# Patient Record
Sex: Female | Born: 1985 | Race: Black or African American | Hispanic: No | Marital: Single | Smoking: Never smoker
Health system: Southern US, Community
[De-identification: ages and names within clinical notes are randomized; demographics above are authoritative.]

## PROBLEM LIST (undated history)

## (undated) DIAGNOSIS — D649 Anemia, unspecified: Secondary | ICD-10-CM

## (undated) DIAGNOSIS — Z789 Other specified health status: Secondary | ICD-10-CM

## (undated) DIAGNOSIS — R011 Cardiac murmur, unspecified: Secondary | ICD-10-CM

## (undated) HISTORY — DX: Other specified health status: Z78.9

## (undated) HISTORY — PX: NO PAST SURGERIES: SHX2092

---
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
## 2015-06-23 ENCOUNTER — Emergency Department (HOSPITAL_COMMUNITY)
Admission: EM | Admit: 2015-06-23 | Discharge: 2015-06-23 | Disposition: A | Discharge status: Home / Self Care | Payer: Self-pay | Attending: Emergency Medicine | Admitting: Emergency Medicine

## 2015-06-23 ENCOUNTER — Encounter (HOSPITAL_COMMUNITY): Payer: Self-pay | Admitting: Nurse Practitioner

## 2015-06-23 DIAGNOSIS — R112 Nausea with vomiting, unspecified: Secondary | ICD-10-CM | POA: Insufficient documentation

## 2015-06-23 DIAGNOSIS — R197 Diarrhea, unspecified: Secondary | ICD-10-CM | POA: Insufficient documentation

## 2015-06-23 DIAGNOSIS — Z3202 Encounter for pregnancy test, result negative: Secondary | ICD-10-CM | POA: Insufficient documentation

## 2015-06-23 LAB — COMPREHENSIVE METABOLIC PANEL
ALBUMIN: 4.1 g/dL (ref 3.5–5.0)
ALT: 14 U/L (ref 14–54)
ANION GAP: 9 (ref 5–15)
AST: 21 U/L (ref 15–41)
Alkaline Phosphatase: 51 U/L (ref 38–126)
BILIRUBIN TOTAL: 1.2 mg/dL (ref 0.3–1.2)
BUN: 8 mg/dL (ref 6–20)
CO2: 23 mmol/L (ref 22–32)
Calcium: 9.1 mg/dL (ref 8.9–10.3)
Chloride: 101 mmol/L (ref 101–111)
Creatinine, Ser: 0.69 mg/dL (ref 0.44–1.00)
GFR calc Af Amer: >60 mL/min (ref >60)
GFR calc non Af Amer: >60 mL/min (ref >60)
GLUCOSE: 97 mg/dL (ref 65–99)
POTASSIUM: 3.9 mmol/L (ref 3.5–5.1)
SODIUM: 133 mmol/L — AB (ref 135–145)
TOTAL PROTEIN: 7.3 g/dL (ref 6.5–8.1)

## 2015-06-23 LAB — CBC
HEMATOCRIT: 33.5 % — AB (ref 36.0–46.0)
HEMOGLOBIN: 9.9 g/dL — AB (ref 12.0–15.0)
MCH: 21.6 pg — ABNORMAL LOW (ref 26.0–34.0)
MCHC: 29.6 g/dL — AB (ref 30.0–36.0)
MCV: 73.1 fL — ABNORMAL LOW (ref 78.0–100.0)
Platelets: 374 10*3/uL (ref 150–400)
RBC: 4.58 MIL/uL (ref 3.87–5.11)
RDW: 17.5 % — ABNORMAL HIGH (ref 11.5–15.5)
WBC: 8.9 10*3/uL (ref 4.0–10.5)

## 2015-06-23 LAB — I-STAT BETA HCG BLOOD, ED (MC, WL, AP ONLY): I-stat hCG, quantitative: <5 m[IU]/mL (ref <5)

## 2015-06-23 MED ORDER — ONDANSETRON HCL 4 MG PO TABS: 4.0000 mg | ORAL_TABLET | Freq: Four times a day (QID) | ORAL | Status: DC

## 2015-06-23 NOTE — ED Notes (Signed)
She reports body aches and nausea when waking this am. She is feeling better now but she did not go to work and they told her she must have a doctors note for this absence. She does have fatigue still. She is A&Ox4, resp e/u

## 2015-06-23 NOTE — ED Provider Notes (Signed)
CSN: [id number]     Arrival date & time 06/23/15  1258 History  By signing my name below, I, Monique Bowman, attest that this documentation has been prepared under the direction and in the presence of Montine Circle, PA-C Electronically Signed: Soijett Bowman, ED Scribe. 06/23/2015. 3:46 PM.   Chief Complaint  Patient presents with  . Nausea      The history is provided by the patient. No language interpreter was used.    HPI Comments: Monique Bowman is a 29 y.o. female who presents to the Emergency Department complaining of nausea onset this morning at 5 AM. She notes that she woke up this morning and had sudden onset of nausea and diarrhea. She notes that she feels better after she vomited while in the ED. 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 She states that she is having associated symptoms of vomiting, general myalgia, and diarrhea. She states that she has not tried any medications for the relief for her symptoms. She denies constipation, dysuria, CP, difficulty breathing, abdominal pain, and any other symptoms.   No past medical history on file. No past surgical history on file. No family history on file. Social History  Substance Use Topics  . Smoking status: Never Smoker   . Smokeless tobacco: Not on file  . Alcohol Use: No   OB History    No data available     Review of Systems  Constitutional: Negative for fever.  Respiratory: Negative for shortness of breath.   Cardiovascular: Negative for chest pain.  Gastrointestinal: Positive for nausea, vomiting and diarrhea. Negative for abdominal pain.  Skin: Negative for color change and wound.      Allergies  Review of patient's allergies indicates no known allergies.  Home Medications   Prior to Admission medications   Not on File   BP 119/60 mmHg  Pulse 102  Temp(Src) 99 F (37.2 C) (Oral)  Resp 17  Ht 5\' 4"  (1.626 m)  Wt 191 lb 6.4 oz  (86.818 kg)  BMI 32.84 kg/m2  SpO2 100%  LMP 06/18/2015 Physical Exam  Constitutional: She is oriented to person, place, and time. She appears well-developed and well-nourished. No distress.  HENT:  Head: Normocephalic and atraumatic.  Eyes: Conjunctivae and EOM are normal. Pupils are equal, round, and reactive to light.  Neck: Normal range of motion. Neck supple.  Cardiovascular: Normal rate and regular rhythm.  Exam reveals no gallop and no friction rub.   No murmur heard. Pulmonary/Chest: Effort normal and breath sounds normal. No respiratory distress. She has no wheezes. She has no rales. She exhibits no tenderness.  Abdominal: Soft. Bowel sounds are normal. She exhibits no distension and no mass. There is no tenderness. There is no rebound and no guarding.  No focal abdominal tenderness, no RLQ tenderness or pain at McBurney's point, no RUQ tenderness or Murphy's sign, no left-sided abdominal tenderness, no fluid wave, or signs of peritonitis   Musculoskeletal: Normal range of motion. She exhibits no edema or tenderness.  Neurological: She is alert and oriented to person, place, and time.  Skin: Skin is warm and dry.  Psychiatric: She has a normal mood and affect. Her behavior is normal. Judgment and thought content normal.  Nursing note and vitals reviewed.   ED Course  Procedures (including critical care time) DIAGNOSTIC STUDIES: Oxygen Saturation is 100% on RA, nl by my interpretation.    COORDINATION OF CARE:  3:44 PM Discussed treatment plan with pt at bedside which includes labs and zofran,  and pt agreed to plan.   Results for orders placed or performed during the hospital encounter of 06/23/15  Comprehensive metabolic panel  Result Value Ref Range   Sodium 133 (L) 135 - 145 mmol/L   Potassium 3.9 3.5 - 5.1 mmol/L   Chloride 101 101 - 111 mmol/L   CO2 23 22 - 32 mmol/L   Glucose, Bld 97 65 - 99 mg/dL   BUN 8 6 - 20 mg/dL   Creatinine, Ser 0.69 0.44 - 1.00 mg/dL    Calcium 9.1 8.9 - 10.3 mg/dL   Total Protein 7.3 6.5 - 8.1 g/dL   Albumin 4.1 3.5 - 5.0 g/dL   AST 21 15 - 41 U/L   ALT 14 14 - 54 U/L   Alkaline Phosphatase 51 38 - 126 U/L   Total Bilirubin 1.2 0.3 - 1.2 mg/dL   GFR calc non Af Amer >60 >60 mL/min   GFR calc Af Amer >60 >60 mL/min   Anion gap 9 5 - 15  CBC  Result Value Ref Range   WBC 8.9 4.0 - 10.5 K/uL   RBC 4.58 3.87 - 5.11 MIL/uL   Hemoglobin 9.9 (L) 12.0 - 15.0 g/dL   HCT 33.5 (L) 36.0 - 46.0 %   MCV 73.1 (L) 78.0 - 100.0 fL   MCH 21.6 (L) 26.0 - 34.0 pg   MCHC 29.6 (L) 30.0 - 36.0 g/dL   RDW 17.5 (H) 11.5 - 15.5 %   Platelets 374 150 - 400 K/uL  I-Stat beta hCG blood, ED ([facility], [facility], [facility] only)  Result Value Ref Range   I-stat hCG, quantitative <5.0 <5 mIU/mL   Comment 3           No results found.    MDM   Final diagnoses:  Nausea and vomiting, vomiting of unspecified type    Patient with nausea and vomiting. She also reports generalized body aches. She states that she is feeling much better now having had one episode of vomiting. She denies any abdominal pain. Denies any chest pain or shortness breath. Denies any dysuria. Her vital signs and laboratory studies are reassuring. Pregnancy test negative. No focal abdominal pain. Abdomen is soft and non-tender. Patient is well-appearing. She is not in any apparent distress. I this time, patient can be managed on an outpatient basis. Will prescribe Zofran for nausea. Specific return precautions have been discussed with the patient. She will need to return if she has any worsening symptoms, or focal abdominal pain, or fever. Patient understands and agrees to plan. She is stable and ready for discharge.  Patient instructed to return for:  New or worsening symptoms, including, increased abdominal pain, especially pain that localizes to one side, bloody vomit, bloody diarrhea, fever >101, and intractable vomiting.  I, [name], personally performed the services described  in this documentation. All medical record entries made by the scribe were at my direction and in my presence.  I have reviewed the chart and discharge instructions and agree that the record reflects my personal performance and is accurate and complete. Demitrious Mccannon.  06/23/2015. 3:50 PM.       Montine Circle, PA-C 06/23/15 [place], MD 06/24/15 1752

## 2015-06-23 NOTE — Discharge Instructions (Signed)

## 2015-09-29 ENCOUNTER — Encounter (HOSPITAL_COMMUNITY): Payer: Self-pay

## 2015-09-29 ENCOUNTER — Emergency Department (HOSPITAL_COMMUNITY): Payer: Managed Care, Other (non HMO)

## 2015-09-29 ENCOUNTER — Emergency Department (HOSPITAL_COMMUNITY)
Admission: EM | Admit: 2015-09-29 | Discharge: 2015-09-29 | Disposition: A | Discharge status: Home / Self Care | Payer: Self-pay | Attending: Emergency Medicine | Admitting: Emergency Medicine

## 2015-09-29 DIAGNOSIS — R197 Diarrhea, unspecified: Secondary | ICD-10-CM | POA: Insufficient documentation

## 2015-09-29 DIAGNOSIS — R1084 Generalized abdominal pain: Secondary | ICD-10-CM | POA: Insufficient documentation

## 2015-09-29 DIAGNOSIS — Z79899 Other long term (current) drug therapy: Secondary | ICD-10-CM | POA: Insufficient documentation

## 2015-09-29 DIAGNOSIS — R112 Nausea with vomiting, unspecified: Secondary | ICD-10-CM | POA: Insufficient documentation

## 2015-09-29 DIAGNOSIS — Z3202 Encounter for pregnancy test, result negative: Secondary | ICD-10-CM | POA: Insufficient documentation

## 2015-09-29 LAB — URINALYSIS, ROUTINE W REFLEX MICROSCOPIC
BILIRUBIN URINE: NEGATIVE
Glucose, UA: NEGATIVE mg/dL
Hgb urine dipstick: NEGATIVE
Ketones, ur: NEGATIVE mg/dL
LEUKOCYTES UA: NEGATIVE
Nitrite: NEGATIVE
PROTEIN: NEGATIVE mg/dL
SPECIFIC GRAVITY, URINE: 1.028 (ref 1.005–1.030)
pH: 5.5 (ref 5.0–8.0)

## 2015-09-29 LAB — I-STAT BETA HCG BLOOD, ED (MC, WL, AP ONLY)

## 2015-09-29 LAB — COMPREHENSIVE METABOLIC PANEL
ALT: 11 U/L — ABNORMAL LOW (ref 14–54)
AST: 17 U/L (ref 15–41)
Albumin: 4.3 g/dL (ref 3.5–5.0)
Alkaline Phosphatase: 47 U/L (ref 38–126)
Anion gap: 10 (ref 5–15)
BUN: 13 mg/dL (ref 6–20)
CHLORIDE: 107 mmol/L (ref 101–111)
CO2: 21 mmol/L — ABNORMAL LOW (ref 22–32)
Calcium: 8.8 mg/dL — ABNORMAL LOW (ref 8.9–10.3)
Creatinine, Ser: 0.6 mg/dL (ref 0.44–1.00)
GFR calc Af Amer: >60 mL/min (ref >60)
Glucose, Bld: 100 mg/dL — ABNORMAL HIGH (ref 65–99)
POTASSIUM: 3.8 mmol/L (ref 3.5–5.1)
Sodium: 138 mmol/L (ref 135–145)
Total Bilirubin: 1.8 mg/dL — ABNORMAL HIGH (ref 0.3–1.2)
Total Protein: 7.6 g/dL (ref 6.5–8.1)

## 2015-09-29 LAB — CBC
HEMATOCRIT: 32.6 % — AB (ref 36.0–46.0)
HEMOGLOBIN: 9.6 g/dL — AB (ref 12.0–15.0)
MCH: 21.6 pg — ABNORMAL LOW (ref 26.0–34.0)
MCHC: 29.4 g/dL — ABNORMAL LOW (ref 30.0–36.0)
MCV: 73.4 fL — AB (ref 78.0–100.0)
Platelets: 384 10*3/uL (ref 150–400)
RBC: 4.44 MIL/uL (ref 3.87–5.11)
RDW: 17.1 % — ABNORMAL HIGH (ref 11.5–15.5)
WBC: 8.1 10*3/uL (ref 4.0–10.5)

## 2015-09-29 LAB — LIPASE, BLOOD: LIPASE: 23 U/L (ref 11–51)

## 2015-09-29 MED ORDER — ONDANSETRON HCL 4 MG PO TABS: 4.0000 mg | ORAL_TABLET | Freq: Four times a day (QID) | ORAL | Status: DC

## 2015-09-29 MED ORDER — ONDANSETRON HCL 4 MG/2ML IJ SOLN
4.0000 mg | Freq: Once | INTRAMUSCULAR | Status: AC
  Administered 2015-09-29: 4 mg via INTRAVENOUS
  Filled 2015-09-29: qty 2

## 2015-09-29 MED ORDER — SODIUM CHLORIDE 0.9 % IV BOLUS (SEPSIS)
1000.0000 mL | Freq: Once | INTRAVENOUS | Status: AC
  Administered 2015-09-29: 1000 mL via INTRAVENOUS

## 2015-09-29 NOTE — Discharge Instructions (Signed)
Return to the ED with any concerns including vomiting and not able to keep down liquids, abdominal pain that worsens and/or localizes to the right lower abdomen, decreased level of alertness/lethargy, or any other alarming symptoms

## 2015-09-29 NOTE — ED Provider Notes (Signed)
CSN: [id number]     Arrival date & time 09/29/15  0906 History   First MD Initiated Contact with Patient 09/29/15 0914     Chief Complaint  Patient presents with  . Abdominal Pain  . Emesis     (Consider location/radiation/quality/duration/timing/severity/associated sxs/prior Treatment) HPI  Pt presenting with c/o nausea and vomiting which started last night.  Pt states she vomited approx 6 times last night.  D/w diffuse abdominal pain.  Has had frequent bowel movements as well but no diarrhea.  No fever/chills.  She has tried to drink water but has thrown it back up. Has had coworkers that were sick with similar illness.  Symptoms are constant and ongoing.  No recent travel. There are no other associated systemic symptoms, there are no other alleviating or modifying factors.   History reviewed. No pertinent past medical history. History reviewed. No pertinent past surgical history. History reviewed. No pertinent family history. Social History  Substance Use Topics  . Smoking status: Never Smoker   . Smokeless tobacco: None  . Alcohol Use: No   OB History    No data available     Review of Systems  ROS reviewed and all otherwise negative except for mentioned in HPI    Allergies  Review of patient's allergies indicates no known allergies.  Home Medications   Prior to Admission medications   Medication Sig Start Date End Date Taking? Authorizing Provider  ibuprofen (ADVIL,MOTRIN) 200 MG tablet Take 200 mg by mouth every 6 (six) hours as needed for headache or moderate pain.   Yes Historical Provider, MD  PROTEIN PO Take 1 tablet by mouth daily.   Yes Historical Provider, MD  ondansetron (ZOFRAN) 4 MG tablet Take 1 tablet (4 mg total) by mouth every 6 (six) hours. 09/29/15   Alfonzo Beers, MD   BP 107/65 mmHg  Pulse 90  Temp(Src) 99.8 F (37.7 C) (Oral)  Resp 18  SpO2 100%  LMP 09/13/2015  Vitals reviewed Physical Exam  Physical Examination: General appearance - alert,  well appearing, and in no distress Mental status - alert, oriented to person, place, and time Eyes - no conjunctival injection no scleral icterus Mouth - mucous membranes moist, pharynx normal without lesions Chest - clear to auscultation, no wheezes, rales or rhonchi, symmetric air entry Heart - normal rate, regular rhythm, normal S1, S2, no murmurs, rubs, clicks or gallops Abdomen - soft, nontender, nondistended, no masses or organomegaly, nabs Neurological - alert, oriented, normal speech Extremities - peripheral pulses normal, no pedal edema, no clubbing or cyanosis Skin - normal coloration and turgor, no rashes  ED Course  Procedures (including critical care time) Labs Review Labs Reviewed  COMPREHENSIVE METABOLIC PANEL - Abnormal; Notable for the following:    CO2 21 (*)    Glucose, Bld 100 (*)    Calcium 8.8 (*)    ALT 11 (*)    Total Bilirubin 1.8 (*)    All other components within normal limits  CBC - Abnormal; Notable for the following:    Hemoglobin 9.6 (*)    HCT 32.6 (*)    MCV 73.4 (*)    MCH 21.6 (*)    MCHC 29.4 (*)    RDW 17.1 (*)    All other components within normal limits  LIPASE, BLOOD  URINALYSIS, ROUTINE W REFLEX MICROSCOPIC (NOT AT [facility])  I-STAT BETA HCG BLOOD, ED ([facility], [facility], [facility] ONLY)    Imaging Review US Abdomen Limited  09/29/2015  CLINICAL DATA:  One  day history of abdominal pain and vomiting EXAM: US ABDOMEN LIMITED - RIGHT UPPER QUADRANT COMPARISON:  None. FINDINGS: Gallbladder: No gallstones or wall thickening visualized. There is no pericholecystic fluid. No sonographic Murphy sign noted by sonographer. Common bile duct: Diameter: 4 mm. No intrahepatic or extrahepatic biliary duct dilatation. Liver: No focal lesion identified. Within normal limits in parenchymal echogenicity. IMPRESSION: Study within normal limits. Electronically Signed   By: Lowella Grip III M.D.   On: 09/29/2015 12:30   I have personally reviewed and evaluated these images and  lab results as part of my medical decision-making.   EKG Interpretation None      MDM   Final diagnoses:  Nausea vomiting and diarrhea    Pt presenting with c/o nausea and vomting which began this morning.  Labs are reassuring with the exception of elevated bilirubin- pt treated with IV fluids and antiemetics, abdominal ultrasound obtained and normal.  Discharged with strict return precautions.  Pt agreeable with plan.    Alfonzo Beers, MD 09/29/15 1452

## 2015-09-29 NOTE — ED Notes (Signed)
Pt c/o intermittent generalized abdominal pain and emesis starting last night.  Pain score 4/10.  Denies diarrhea.  Pt reports coworkers have been sick.

## 2015-09-29 NOTE — ED Notes (Signed)
US at bedside

## 2017-04-27 IMAGING — US US ABDOMEN LIMITED
1 series · 14 of 25 positions shown · non-contrast
Comparison: None.

CLINICAL DATA: One day history of abdominal pain and vomiting

EXAM:
US ABDOMEN LIMITED - RIGHT UPPER QUADRANT

[Series 1: us abdomen limited · 0.20mm/px · 14 of 55 slices shown]
[im 1/55]
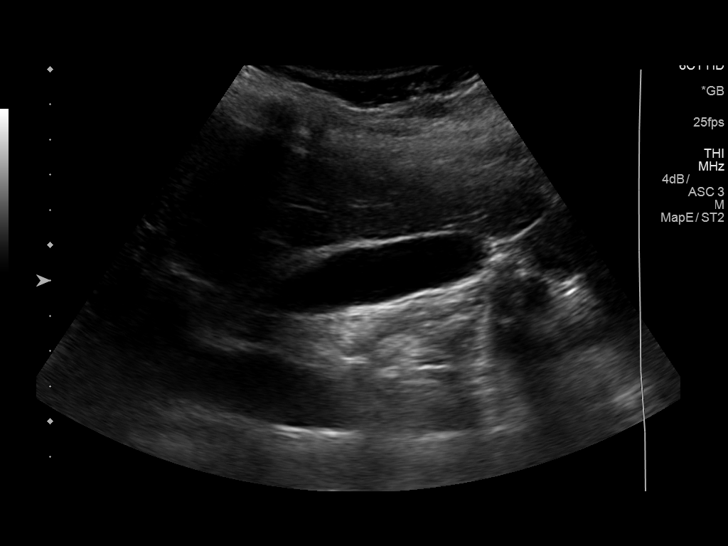
[im 5/55]
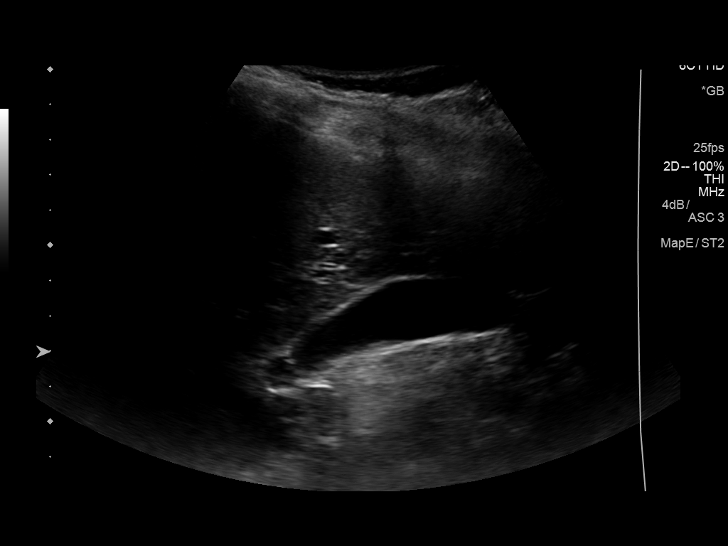
[im 10/55]
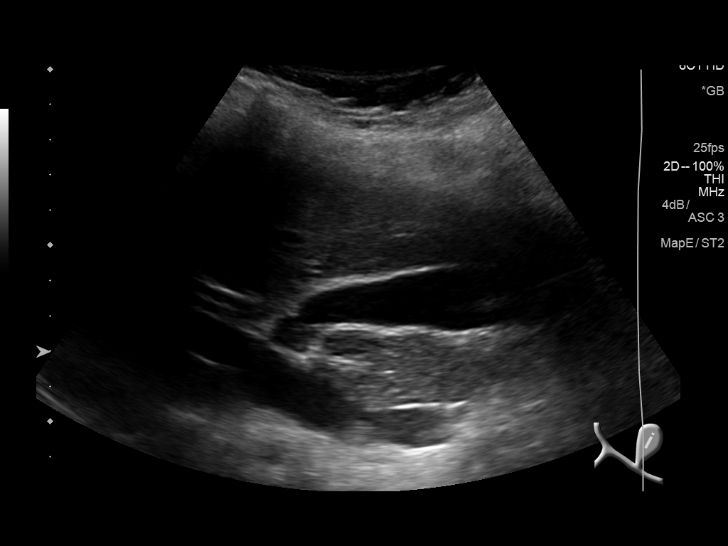
[im 14/55]
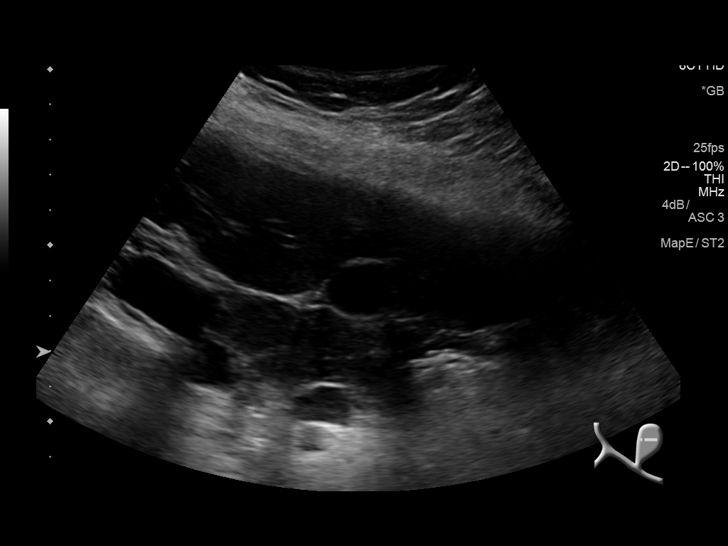
[im 19/55]
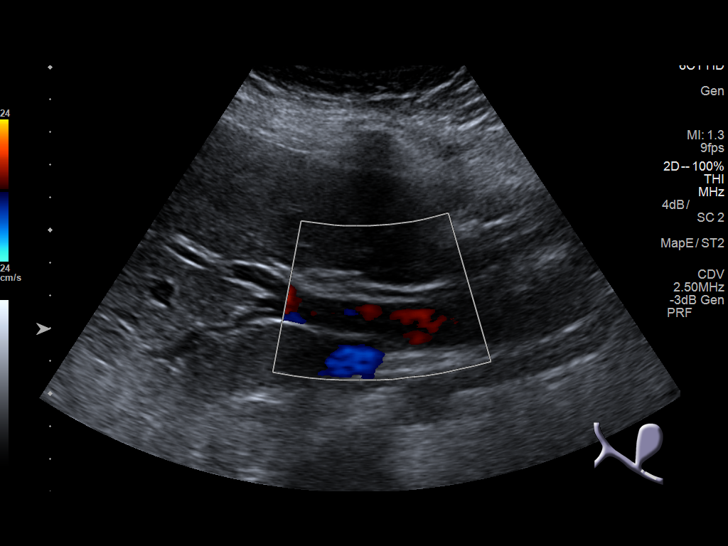
[im 21/55]
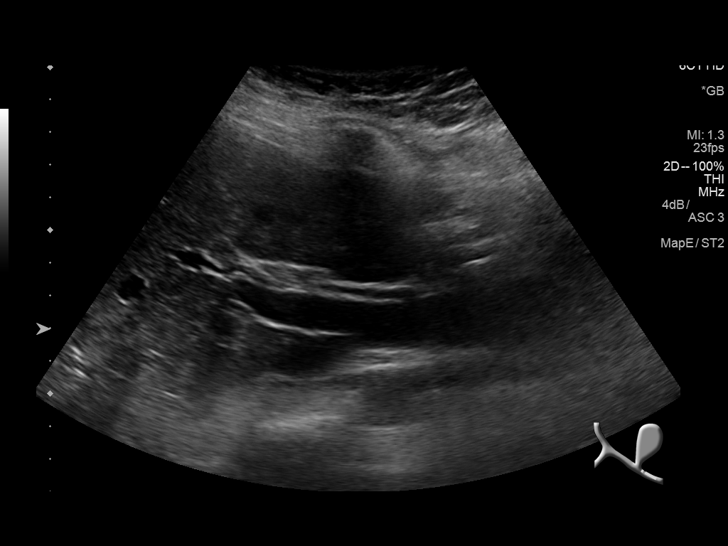
[im 25/55]
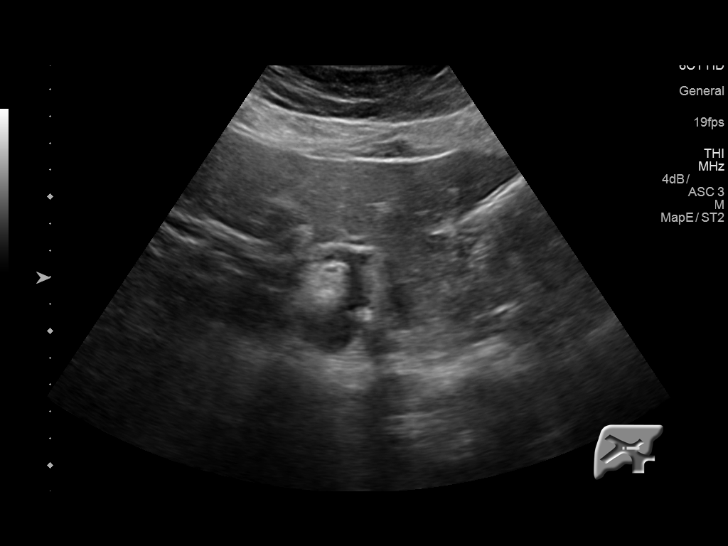
[im 30/55]
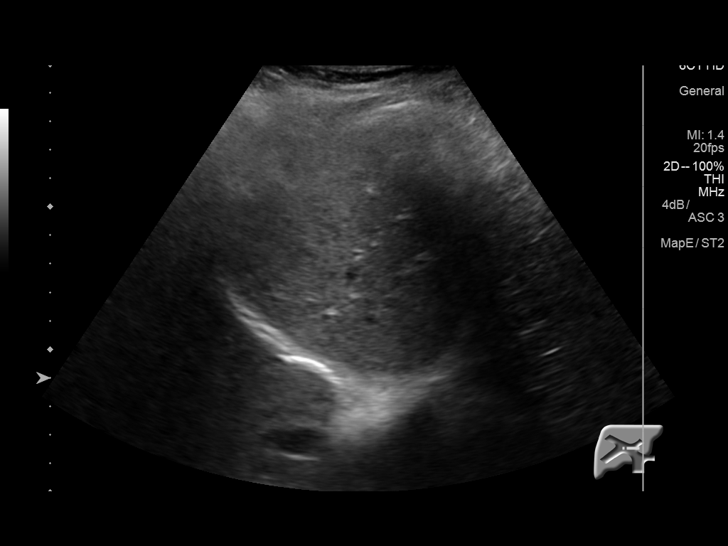
[im 34/55]
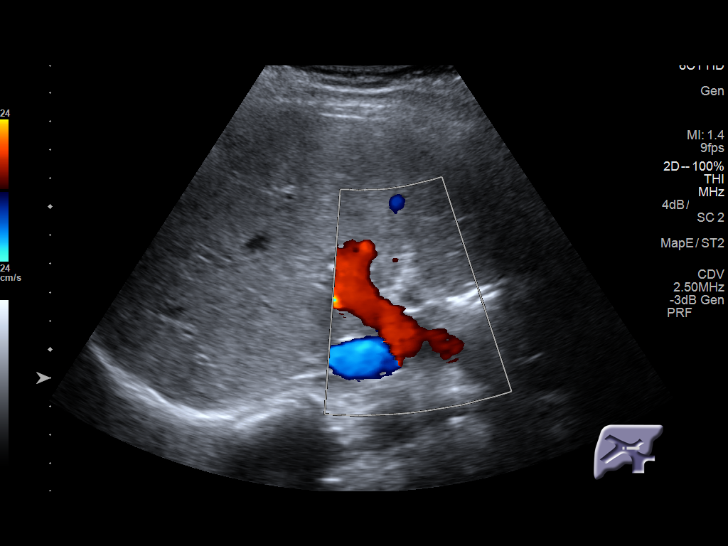
[im 37/55]
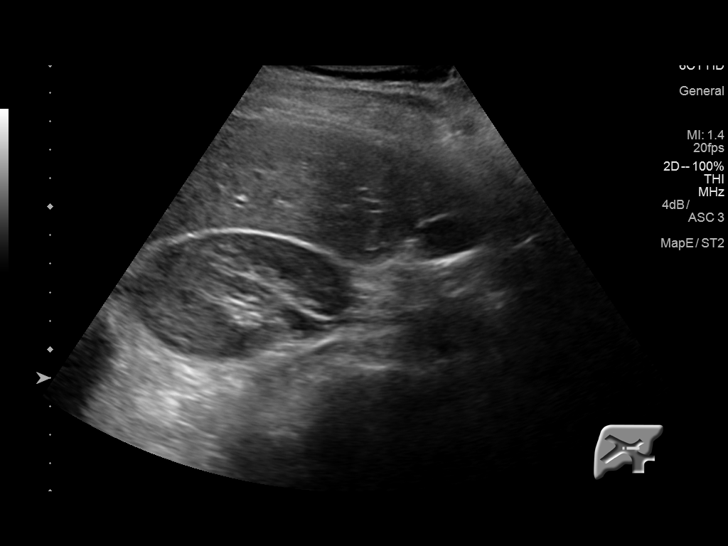
[im 41/55]
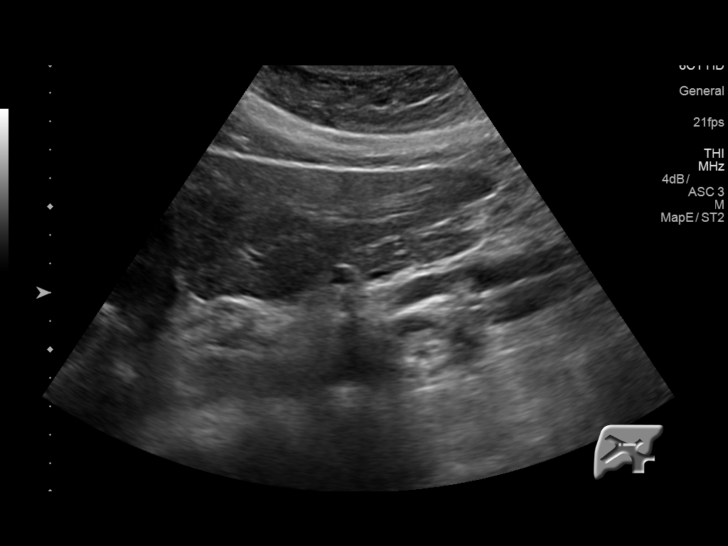
[im 46/55]
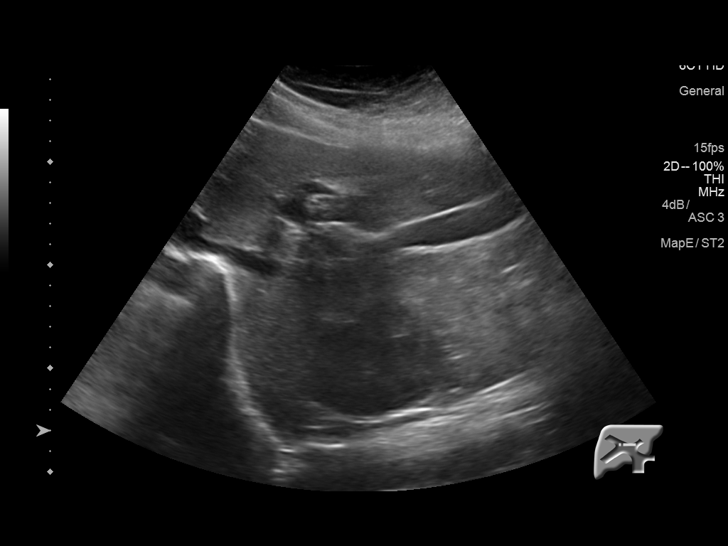
[im 50/55]
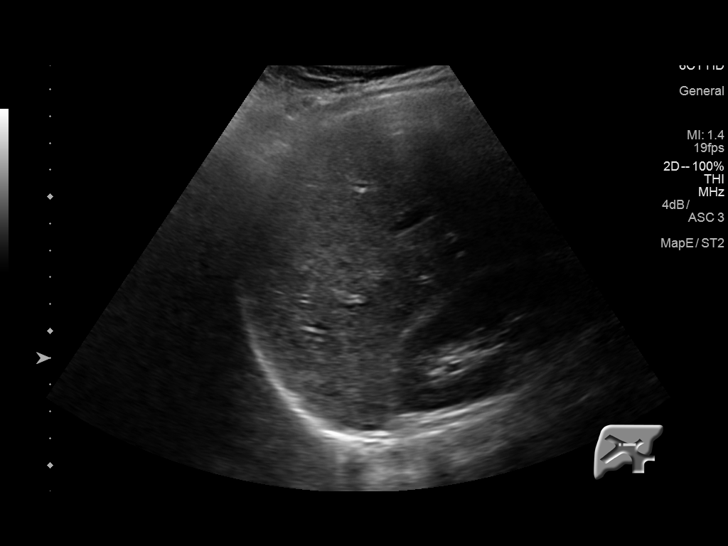
[im 55/55]
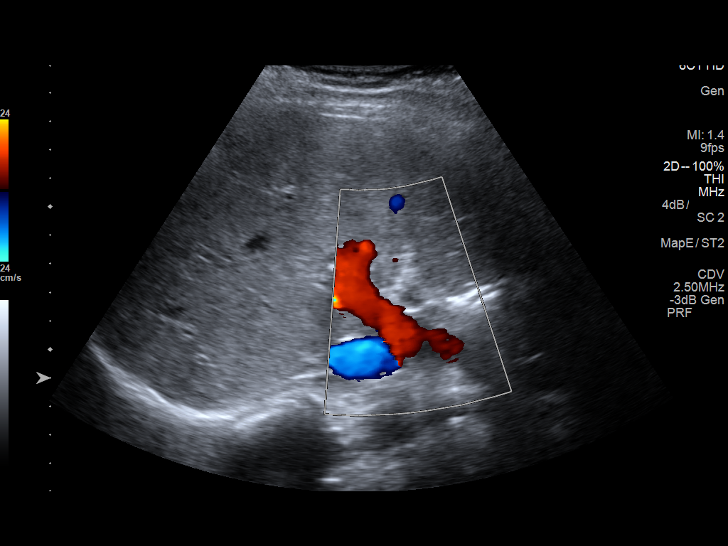

[14 of 25 positions shown; findings below may reference images not displayed]

FINDINGS: Gallbladder:

No gallstones or wall thickening visualized. There is no
pericholecystic fluid. No sonographic Murphy sign noted by
sonographer.

Common bile duct:

Diameter: 4 mm. No intrahepatic or extrahepatic biliary duct
dilatation.

Liver:

No focal lesion identified. Within normal limits in parenchymal
echogenicity.
IMPRESSION: Study within normal limits.

## 2019-12-29 ENCOUNTER — Ambulatory Visit: Payer: Managed Care, Other (non HMO) | Admitting: Family Medicine

## 2020-01-14 ENCOUNTER — Other Ambulatory Visit: Payer: Self-pay

## 2020-01-14 ENCOUNTER — Ambulatory Visit: Payer: Self-pay | Attending: Family Medicine | Admitting: Family Medicine

## 2020-01-14 ENCOUNTER — Encounter: Payer: Self-pay | Admitting: Family Medicine

## 2020-01-14 VITALS — BP 115/55 | HR 102 | Temp 97.7°F | Ht 64.0 in | Wt 188.2 lb

## 2020-01-14 DIAGNOSIS — R5383 Other fatigue: Secondary | ICD-10-CM

## 2020-01-14 DIAGNOSIS — R0609 Other forms of dyspnea: Secondary | ICD-10-CM

## 2020-01-14 DIAGNOSIS — R000 Tachycardia, unspecified: Secondary | ICD-10-CM

## 2020-01-14 DIAGNOSIS — Z789 Other specified health status: Secondary | ICD-10-CM | POA: Insufficient documentation

## 2020-01-14 DIAGNOSIS — Z599 Problem related to housing and economic circumstances, unspecified: Secondary | ICD-10-CM

## 2020-01-14 DIAGNOSIS — Z598 Other problems related to housing and economic circumstances: Secondary | ICD-10-CM

## 2020-01-14 DIAGNOSIS — R0600 Dyspnea, unspecified: Secondary | ICD-10-CM

## 2020-01-14 NOTE — Progress Notes (Signed)
New Patient   Need labs  Elevated HR  Financial Assistance

## 2020-01-14 NOTE — Progress Notes (Addendum)
Subjective:  Patient ID: Monique Bowman, female    DOB: 03/28/1986  Age: 34 y.o. MRN: [id number]  CC: Establish care, financial difficulty  HPI Monique Bowman, 34 year old female who presents to establish care, who reports that she has had no significant past medical issues however she has had recent sensation of increased heart rate especially with activity and she has noticed that she has shortness of breath with activity as well.  She has been trying to lose weight and her health coach has noticed that even though patient has been working out for about 6 weeks now, patient continues to have an elevated heart rate in the 90s to low 100s and patient still feels short of breath when exercising.  She has noticed that lately she has been more fatigued.  She has had no recent medical follow-up due to lack of insurance and she learned about a program through this office that can help with the cost of medical care.  She denies any issues with headaches or dizziness, no chest pain or palpitations other than episodes of sensation of increased heart rate.  She has had no abdominal pain-no nausea/vomiting/diarrhea or constipation.  She denies any bright red blood in the stool but she thinks that she had some black dots in her stool a few weeks ago.  Her menses do occur regularly and are sometimes heavy.  She denies any urinary frequency, urgency or dysuria.  No increased thirst or blurred vision.  No peripheral edema.  No thoughts of self-harm.  She feels slightly anxious about her health and financial difficulties.  Past Medical History:  Diagnosis Date  . Known health problems: none     Past Surgical History:  Procedure Laterality Date  . NO PAST SURGERIES      Family History  Problem Relation Age of Onset  . Diabetes Mother   . Breast cancer Mother        remission  . Ovarian cancer Mother        remission  . Congenital heart disease Brother        Died at age 69    Social History    Tobacco Use  . Smoking status: Never Smoker  . Smokeless tobacco: Never Used  Substance Use Topics  . Alcohol use: No    ROS Review of Systems  Constitutional: Positive for fatigue. Negative for chills and fever.  HENT: Negative for sore throat and trouble swallowing.   Eyes: Negative for photophobia and visual disturbance.  Respiratory: Negative for cough and shortness of breath.   Cardiovascular: Negative for chest pain, palpitations (No abnormal heart rhythms but sensation of increased heart rate especially with activity) and leg swelling.  Gastrointestinal: Negative for abdominal pain, constipation, diarrhea, nausea and vomiting.       Noticed some black spots in her stool a few weeks ago  Endocrine: Negative for cold intolerance, heat intolerance, polydipsia, polyphagia and polyuria.  Genitourinary: Negative for dysuria and frequency.  Musculoskeletal: Negative for arthralgias and back pain.  Skin: Negative for rash and wound.  Neurological: Negative for dizziness and headaches.  Hematological: Negative for adenopathy. Does not bruise/bleed easily.  Psychiatric/Behavioral: Negative for self-injury and suicidal ideas. The patient is nervous/anxious (About finances/health).     Objective:   Today's Vitals: BP (!) 115/55   Pulse (!) 102   Temp 97.7 F (36.5 C) (Temporal)   Ht 5\' 4"  (1.626 m)   Wt 188 lb 3.2 oz (85.4 kg)   LMP 01/02/2020 (Exact  Date)   SpO2 100%   BMI 32.30 kg/m   Physical Exam Vitals and nursing note reviewed.  Constitutional:      Appearance: Normal appearance.     Comments: Overweight for height female who appears younger than stated age in no acute distress wearing mask as per office COVID-19 protocol  Neck:     Vascular: No carotid bruit.  Cardiovascular:     Rate and Rhythm: Regular rhythm. Tachycardia present.     Heart sounds: No murmur (Possible soft murmur).  Pulmonary:     Effort: Pulmonary effort is normal.     Breath sounds: Normal  breath sounds.  Abdominal:     Palpations: Abdomen is soft.     Tenderness: There is no abdominal tenderness. There is no right CVA tenderness, left CVA tenderness, guarding or rebound.  Musculoskeletal:        General: No tenderness or deformity.     Cervical back: Normal range of motion and neck supple. No tenderness.     Right lower leg: No edema.     Left lower leg: No edema.  Lymphadenopathy:     Cervical: No cervical adenopathy.  Skin:    General: Skin is warm and dry.  Neurological:     General: No focal deficit present.     Mental Status: She is alert and oriented to person, place, and time.     Cranial Nerves: No cranial nerve deficit.  Psychiatric:        Mood and Affect: Mood normal.        Behavior: Behavior normal.     Assessment & Plan:  1. Increased heart rate Patient with complaint of sensation of increased heart rate and shortness of breath with exertion..  Will check complete blood count to look for anemia, BMP to look for electrolyte abnormality and T4/TSH to look for thyroid abnormality/hyperthyroidism which may be causing increased heart rate. - CBC - Basic Metabolic Panel - T4 AND TSH  2. Dyspnea on exertion CBC to look for anemia and patient may need chest x-ray if labs are normal and she has continued issues with shortness of breath with exertion.  She denies any history of smoking. - CBC  3. Fatigue, unspecified type She reports recent increase in fatigue.  She denies any possibility of pregnancy as she has never been sexually active.  She will have CBC to look for anemia/blood disorder, BMP to look for elevated blood sugar, renal disorder or electrolyte abnormality, T4 and TSH to look for thyroid abnormality and vitamin D level to look for vitamin D deficiency which may be contributed to look healthy her fatigue. - CBC - Basic Metabolic Panel - T4 AND TSH - Vitamin D, 25-hydroxy  4.  Financial difficulties; 5.  Need for follow-up by social work She  agrees to be contacted by the medical social worker to help with ongoing issues with increased anxiety related to financial issues.  Outpatient Encounter Medications as of 01/14/2020  Medication Sig  . ibuprofen (ADVIL,MOTRIN) 200 MG tablet Take 200 mg by mouth every 6 (six) hours as needed for headache or moderate pain.  Marland Kitchen ondansetron (ZOFRAN) 4 MG tablet Take 1 tablet (4 mg total) by mouth every 6 (six) hours. (Patient not taking: Reported on 01/14/2020)  . PROTEIN PO Take 1 tablet by mouth daily.   No facility-administered encounter medications on file as of 01/14/2020.    An After Visit Summary was printed and given to the patient.   Follow-up: Return in  about 3 weeks (around 02/04/2020) for increased heart rate/SOB.  Return sooner if any questions or concerns, go to ED if any acute worsening of symptoms   Coleson Kant MD

## 2020-01-15 ENCOUNTER — Observation Stay (HOSPITAL_COMMUNITY)
Admission: EM | Admit: 2020-01-15 | Discharge: 2020-01-16 | Disposition: A | Discharge status: Home / Self Care | Payer: Self-pay | Attending: Family Medicine | Admitting: Family Medicine

## 2020-01-15 ENCOUNTER — Observation Stay (HOSPITAL_COMMUNITY): Payer: Self-pay

## 2020-01-15 ENCOUNTER — Encounter: Payer: Self-pay | Admitting: Family Medicine

## 2020-01-15 ENCOUNTER — Other Ambulatory Visit: Payer: Self-pay

## 2020-01-15 ENCOUNTER — Telehealth: Payer: Self-pay | Admitting: Family Medicine

## 2020-01-15 ENCOUNTER — Encounter (HOSPITAL_COMMUNITY): Payer: Self-pay

## 2020-01-15 DIAGNOSIS — Z803 Family history of malignant neoplasm of breast: Secondary | ICD-10-CM | POA: Insufficient documentation

## 2020-01-15 DIAGNOSIS — R198 Other specified symptoms and signs involving the digestive system and abdomen: Secondary | ICD-10-CM

## 2020-01-15 DIAGNOSIS — N83201 Unspecified ovarian cyst, right side: Secondary | ICD-10-CM | POA: Insufficient documentation

## 2020-01-15 DIAGNOSIS — R1900 Intra-abdominal and pelvic swelling, mass and lump, unspecified site: Secondary | ICD-10-CM

## 2020-01-15 DIAGNOSIS — Z6832 Body mass index (BMI) 32.0-32.9, adult: Secondary | ICD-10-CM | POA: Insufficient documentation

## 2020-01-15 DIAGNOSIS — R5383 Other fatigue: Secondary | ICD-10-CM | POA: Insufficient documentation

## 2020-01-15 DIAGNOSIS — D259 Leiomyoma of uterus, unspecified: Secondary | ICD-10-CM | POA: Insufficient documentation

## 2020-01-15 DIAGNOSIS — Z833 Family history of diabetes mellitus: Secondary | ICD-10-CM | POA: Insufficient documentation

## 2020-01-15 DIAGNOSIS — Z8249 Family history of ischemic heart disease and other diseases of the circulatory system: Secondary | ICD-10-CM | POA: Insufficient documentation

## 2020-01-15 DIAGNOSIS — D649 Anemia, unspecified: Secondary | ICD-10-CM | POA: Diagnosis present

## 2020-01-15 DIAGNOSIS — Z791 Long term (current) use of non-steroidal anti-inflammatories (NSAID): Secondary | ICD-10-CM | POA: Insufficient documentation

## 2020-01-15 DIAGNOSIS — Z8041 Family history of malignant neoplasm of ovary: Secondary | ICD-10-CM

## 2020-01-15 DIAGNOSIS — E669 Obesity, unspecified: Secondary | ICD-10-CM | POA: Insufficient documentation

## 2020-01-15 DIAGNOSIS — D219 Benign neoplasm of connective and other soft tissue, unspecified: Secondary | ICD-10-CM

## 2020-01-15 DIAGNOSIS — Z20822 Contact with and (suspected) exposure to covid-19: Secondary | ICD-10-CM | POA: Insufficient documentation

## 2020-01-15 DIAGNOSIS — R000 Tachycardia, unspecified: Secondary | ICD-10-CM | POA: Insufficient documentation

## 2020-01-15 DIAGNOSIS — R824 Acetonuria: Secondary | ICD-10-CM | POA: Insufficient documentation

## 2020-01-15 DIAGNOSIS — D509 Iron deficiency anemia, unspecified: Principal | ICD-10-CM | POA: Insufficient documentation

## 2020-01-15 LAB — IRON AND TIBC
Iron: 15 ug/dL — ABNORMAL LOW (ref 28–170)
Saturation Ratios: 3 % — ABNORMAL LOW (ref 10.4–31.8)
TIBC: 470 ug/dL — ABNORMAL HIGH (ref 250–450)
UIBC: 455 ug/dL

## 2020-01-15 LAB — CBC WITH DIFFERENTIAL/PLATELET
Abs Immature Granulocytes: 0.03 10*3/uL (ref 0.00–0.07)
Basophils Absolute: 0 10*3/uL (ref 0.0–0.1)
Basophils Relative: 0 %
Eosinophils Absolute: 0.1 10*3/uL (ref 0.0–0.5)
Eosinophils Relative: 1 %
HCT: 15.4 % — ABNORMAL LOW (ref 36.0–46.0)
Hemoglobin: 3.7 g/dL — CL (ref 12.0–15.0)
Immature Granulocytes: 0 %
Lymphocytes Relative: 27 %
Lymphs Abs: 1.8 10*3/uL (ref 0.7–4.0)
MCH: 14 pg — ABNORMAL LOW (ref 26.0–34.0)
MCHC: 24 g/dL — ABNORMAL LOW (ref 30.0–36.0)
MCV: 58.3 fL — ABNORMAL LOW (ref 80.0–100.0)
Monocytes Absolute: 0.6 10*3/uL (ref 0.1–1.0)
Monocytes Relative: 9 %
Neutro Abs: 4.2 10*3/uL (ref 1.7–7.7)
Neutrophils Relative %: 63 %
Platelets: 629 10*3/uL — ABNORMAL HIGH (ref 150–400)
RBC: 2.64 MIL/uL — ABNORMAL LOW (ref 3.87–5.11)
RDW: 23.2 % — ABNORMAL HIGH (ref 11.5–15.5)
WBC: 6.8 10*3/uL (ref 4.0–10.5)
nRBC: 0.4 % — ABNORMAL HIGH (ref 0.0–0.2)

## 2020-01-15 LAB — VITAMIN B12: Vitamin B-12: 595 pg/mL (ref 180–914)

## 2020-01-15 LAB — URINALYSIS, ROUTINE W REFLEX MICROSCOPIC
Bacteria, UA: NONE SEEN
Bilirubin Urine: NEGATIVE
Glucose, UA: NEGATIVE mg/dL
Ketones, ur: 20 mg/dL — AB
Leukocytes,Ua: NEGATIVE
Nitrite: NEGATIVE
Protein, ur: NEGATIVE mg/dL
Specific Gravity, Urine: 1.009 (ref 1.005–1.030)
pH: 7 (ref 5.0–8.0)

## 2020-01-15 LAB — CBC
HCT: 15.6 % — ABNORMAL LOW (ref 36.0–46.0)
Hematocrit: 14.5 % — CL (ref 34.0–46.6)
Hemoglobin: 3.6 g/dL — CL (ref 11.1–15.9)
Hemoglobin: 3.7 g/dL — CL (ref 12.0–15.0)
MCH: 14.1 pg — ABNORMAL LOW (ref 26.6–33.0)
MCH: 13.8 pg — ABNORMAL LOW (ref 26.0–34.0)
MCHC: 24.8 g/dL — CL (ref 31.5–35.7)
MCHC: 23.7 g/dL — ABNORMAL LOW (ref 30.0–36.0)
MCV: 57 fL — ABNORMAL LOW (ref 79–97)
MCV: 58 fL — ABNORMAL LOW (ref 80.0–100.0)
Platelets: 600 x10E3/uL — ABNORMAL HIGH (ref 150–450)
Platelets: 601 10*3/uL — ABNORMAL HIGH (ref 150–400)
RBC: 2.56 x10E6/uL — CL (ref 3.77–5.28)
RBC: 2.69 MIL/uL — ABNORMAL LOW (ref 3.87–5.11)
RDW: 22 % — ABNORMAL HIGH (ref 11.7–15.4)
RDW: 23.1 % — ABNORMAL HIGH (ref 11.5–15.5)
WBC: 7.7 x10E3/uL (ref 3.4–10.8)
WBC: 6.9 10*3/uL (ref 4.0–10.5)
nRBC: 0.4 % — ABNORMAL HIGH (ref 0.0–0.2)

## 2020-01-15 LAB — COMPREHENSIVE METABOLIC PANEL
ALT: 11 U/L (ref 0–44)
AST: 16 U/L (ref 15–41)
Albumin: 4 g/dL (ref 3.5–5.0)
Alkaline Phosphatase: 31 U/L — ABNORMAL LOW (ref 38–126)
Anion gap: 10 (ref 5–15)
BUN: 12 mg/dL (ref 6–20)
CO2: 22 mmol/L (ref 22–32)
Calcium: 9.5 mg/dL (ref 8.9–10.3)
Chloride: 103 mmol/L (ref 98–111)
Creatinine, Ser: 0.71 mg/dL (ref 0.44–1.00)
GFR calc Af Amer: >60 mL/min (ref >60)
GFR calc non Af Amer: >60 mL/min (ref >60)
Glucose, Bld: 96 mg/dL (ref 70–99)
Potassium: 3.9 mmol/L (ref 3.5–5.1)
Sodium: 135 mmol/L (ref 135–145)
Total Bilirubin: 1.5 mg/dL — ABNORMAL HIGH (ref 0.3–1.2)
Total Protein: 6.8 g/dL (ref 6.5–8.1)

## 2020-01-15 LAB — BASIC METABOLIC PANEL WITH GFR
BUN/Creatinine Ratio: 21 (ref 9–23)
BUN: 15 mg/dL (ref 6–20)
CO2: 22 mmol/L (ref 20–29)
Calcium: 9.3 mg/dL (ref 8.7–10.2)
Chloride: 103 mmol/L (ref 96–106)
Creatinine, Ser: 0.71 mg/dL (ref 0.57–1.00)
GFR calc Af Amer: 129 mL/min/1.73
GFR calc non Af Amer: 112 mL/min/1.73
Glucose: 93 mg/dL (ref 65–99)
Potassium: 4.6 mmol/L (ref 3.5–5.2)
Sodium: 137 mmol/L (ref 134–144)

## 2020-01-15 LAB — RETICULOCYTES
Immature Retic Fract: 27.9 % — ABNORMAL HIGH (ref 2.3–15.9)
RBC.: 2.41 MIL/uL — ABNORMAL LOW (ref 3.87–5.11)
Retic Count, Absolute: 53.3 10*3/uL (ref 19.0–186.0)
Retic Ct Pct: 2.2 % (ref 0.4–3.1)

## 2020-01-15 LAB — T4 AND TSH
T4, Total: 5.7 ug/dL (ref 4.5–12.0)
TSH: 2.69 u[IU]/mL (ref 0.450–4.500)

## 2020-01-15 LAB — I-STAT BETA HCG BLOOD, ED (MC, WL, AP ONLY): I-stat hCG, quantitative: <5 m[IU]/mL (ref <5)

## 2020-01-15 LAB — FOLATE: Folate: 42.7 ng/mL (ref >5.9)

## 2020-01-15 LAB — ABO/RH: ABO/RH(D): O POS

## 2020-01-15 LAB — FERRITIN: Ferritin: 2 ng/mL — ABNORMAL LOW (ref 11–307)

## 2020-01-15 LAB — SARS CORONAVIRUS 2 (TAT 6-24 HRS): SARS Coronavirus 2: NEGATIVE

## 2020-01-15 LAB — PREPARE RBC (CROSSMATCH)

## 2020-01-15 LAB — VITAMIN D 25 HYDROXY (VIT D DEFICIENCY, FRACTURES): Vit D, 25-Hydroxy: 36.3 ng/mL (ref 30.0–100.0)

## 2020-01-15 MED ORDER — SODIUM CHLORIDE 0.9 % IV SOLN
10.0000 mL/h | Freq: Once | INTRAVENOUS | Status: AC
  Administered 2020-01-16: 07:00:00 10 mL/h via INTRAVENOUS

## 2020-01-15 NOTE — H&P (Addendum)
[facility] Admission History and Physical Service Pager: [phone]  Patient name: Monique Bowman Medical record number: [id number] Date of birth: 05-05-1986 Age: 34 y.o. Gender: female  Primary Care Provider: Antony Blackbird, MD Consultants:  Code Status: full  Chief Complaint: symptomatic anemia  Assessment and Plan: Monique Bowman is a 34 y.o. female presenting with symptomatic anemia.  Patient denies any known PMH.   Symptomatic microcytic anemia, iron deficiency: Patient presents with symptomatic anemia.  She said that she had been taking iron supplements from [employer] in the past which she said always made her feel better.  In the past months she has been without these and felt like she was getting winded when she was working out with a Clinical research associate., coincidentally she had went to establish with a new PCP and had blood work drawn when her heart rate was low 100s.  CBC came back with hemoglobin below 4 and she was called and told to go the emergency department.  ED confirmed at 3.7 with stable vitals.  Patient denied any known indication of bleeding or prior official diagnosis of anemia.  On physical exam, there was lower abdominal fullness which patient said was chronic, of note she does have family history of ovarian cancer.  She consents to blood transfusion.  Patient denies sexual activity and hCG negative.  Iron 15, ferritin 2, TIBC 470, MCV 58. -Admit to MedSurg for observation, Dr. Ardelia Mems attending  -Continue order for transfusing 2 units PRBC, recheck hemoglobin after, expect need to order more which has already been discussed with patient -Follow-up UA to rule out hematuria -Follow-up FOBT -Follow-up abdominal ultrasound  Abdominal fullness: Patient with maternal family history of ovarian cancer at an unknown but "young "age.  Physical exam with lower abdominal palpable fullness, anemia to 3.7.  Patient feels well and has no physical complaints other  than fatigue with working out.  Denies any abdominal, gastrointestinal or urinary symptoms. -Follow-up abdominal ultrasound  Hyperbilirubinemia: 1.5 on ED admission.  Expect related to significant anemia and likely to resolve as this is stabilized. -Can recheck once anemia work-up is complete  FEN/GI: N.p.o. pending results of abdominal ultrasound Prophylaxis: SCD  Disposition: obs overnight until hemoglobin stabilizes  History of Present Illness:  Monique Bowman is a 34 y.o. female presenting with symptomatic anemia.  She states that over the last month or 2 since she stopped taking her [employer] iron supplements that she has been feeling more fatigued when working out.  She has had no syncope or other symptoms, denies hematuria, bowel symptoms, abdominal symptoms.  Says she is currently on her menses, these tend to come regularly every 3 to 5 weeks with 5 to 6 days of bleeding that starts heavy as the first day (3-5 full pads ) and then decreases from there.  There is been no significant change recently.  Said that she establish coincidentally with a PCP who noted that she had mild tachycardia and ordered a CBC which showed hemoglobin below 4.  She got a phone call telling her to go to the emergency department in the ED confirmed hemoglobin at 3.7.  Vital signs were stable but she consented to transfusion is being admitted for transfusion and observation.  She denies any known medical problems, she does not take any medications, she has no known medical allergies.  She does have a family history of maternal ovarian and breast cancer  Review Of Systems: Per HPI with the following additions:   Review of  Systems  Constitutional: Negative for chills, fever and weight loss.  HENT: Negative.   Respiratory: Positive for shortness of breath.        Shortness of breath only when working out  Cardiovascular: Negative.   Gastrointestinal: Negative.   Genitourinary: Negative.   Musculoskeletal:  Negative.   Skin: Negative for itching and rash.  Neurological: Negative.   Psychiatric/Behavioral: Negative.     Patient Active Problem List   Diagnosis Date Noted  . Symptomatic anemia 01/15/2020  . Known health problems: none 01/14/2020    Past Medical History: Past Medical History:  Diagnosis Date  . Known health problems: none     Past Surgical History: Past Surgical History:  Procedure Laterality Date  . NO PAST SURGERIES      Social History: Social History   Tobacco Use  . Smoking status: Never Smoker  . Smokeless tobacco: Never Used  Substance Use Topics  . Alcohol use: No  . Drug use: No   Additional social history:   Please also refer to relevant sections of EMR.  Family History: Family History  Problem Relation Age of Onset  . Diabetes Mother   . Breast cancer Mother        remission  . Ovarian cancer Mother        remission  . Congenital heart disease Brother        Died at age 52     Allergies and Medications: No Known Allergies No current facility-administered medications on file prior to encounter.   Current Outpatient Medications on File Prior to Encounter  Medication Sig Dispense Refill  . ibuprofen (ADVIL,MOTRIN) 200 MG tablet Take 200 mg by mouth every 6 (six) hours as needed for headache or moderate pain.      Objective: BP 119/60   Pulse 93   Temp 99.3 F (37.4 C) (Oral)   Resp (!) 24   Ht 5\' 4"  (1.626 m)   Wt 85.3 kg   LMP 01/02/2020 (Exact Date)   SpO2 100%   BMI 32.27 kg/m  Exam: General: Alert pleasant talkative Eyes: No scleral injection no purulence Cardiovascular: 2/6 systolic murmur, regular rate and rhythm Respiratory: Clear to auscultation bilaterally, no increased work of breathing, no wheeze, no cough, no crackles Gastrointestinal: No tenderness to palpation, there is a lower abdominal palpable fullness that patient says is chronic MSK: Moving all limbs spontaneously, no deficit noted Derm: No rash to  exposed skin Neuro: No gross deficits Psych: Alert and appropriately discussing her history of medical care  Labs and Imaging: CBC BMET  Recent Labs  Lab 01/15/20 1753  WBC 6.8  6.9  HGB 3.7*  3.7*  HCT 15.4*  15.6*  PLT 629*  601*   Recent Labs  Lab 01/15/20 1753  NA 135  K 3.9  CL 103  CO2 22  BUN 12  CREATININE 0.71  GLUCOSE 96  CALCIUM 9.5     No results found.  Sherene Sires, DO 01/15/2020, 9:21 PM PGY-3, [place] [facility] pager: [phone], text pages welcome

## 2020-01-15 NOTE — ED Provider Notes (Signed)
[facility] EMERGENCY DEPARTMENT Provider Note   CSN: [id number] Arrival date & time: 01/15/20  1719     History Chief Complaint  Patient presents with  . Abnormal Lab    Monique Bowman is a 34 y.o. female who presents to the ED at the request of her PCP for low hemoglobin.  Patient was seen and evaluated by her PCP for a new patient visit yesterday.  Lab work was collected.  She was told today that her hemoglobin resulted at 3.6.  Patient denies any symptoms, she denies any shortness of breath, lightheadedness, dizziness, chest pain.  She did take multivitamins including iron pills at her own discretion but stopped about a month ago.  She is unsure if she had been told in the past that she was iron deficient.  She does not endorse heavy menstrual cycle and denies any bloody stools or dark stools, abdominal pain.  HPI     Past Medical History:  Diagnosis Date  . Known health problems: none     Patient Active Problem List   Diagnosis Date Noted  . Known health problems: none 01/14/2020    Past Surgical History:  Procedure Laterality Date  . NO PAST SURGERIES       OB History   No obstetric history on file.     Family History  Problem Relation Age of Onset  . Diabetes Mother   . Breast cancer Mother        remission  . Ovarian cancer Mother        remission  . Congenital heart disease Brother        Died at age 29    Social History   Tobacco Use  . Smoking status: Never Smoker  . Smokeless tobacco: Never Used  Substance Use Topics  . Alcohol use: No  . Drug use: No    Home Medications Prior to Admission medications   Medication Sig Start Date End Date Taking? Authorizing Provider  ibuprofen (ADVIL,MOTRIN) 200 MG tablet Take 200 mg by mouth every 6 (six) hours as needed for headache or moderate pain.    [provider]    Allergies    Patient has no known allergies.  Review of Systems   Review of Systems  Constitutional:  Negative for appetite change, chills and fever.  HENT: Negative for ear pain, rhinorrhea, sneezing and sore throat.   Eyes: Negative for photophobia and visual disturbance.  Respiratory: Negative for cough, chest tightness, shortness of breath and wheezing.   Cardiovascular: Negative for chest pain and palpitations.  Gastrointestinal: Negative for abdominal pain, blood in stool, constipation, diarrhea, nausea and vomiting.  Genitourinary: Negative for dysuria, hematuria and urgency.  Musculoskeletal: Negative for myalgias.  Skin: Negative for rash.  Neurological: Negative for dizziness, weakness and light-headedness.    Physical Exam Updated Vital Signs BP 121/83   Pulse 98   Temp 98.3 F (36.8 C) (Oral)   Resp 11   Ht 5\' 4"  (1.626 m)   Wt 85.3 kg   LMP 01/02/2020 (Exact Date)   SpO2 100%   BMI 32.27 kg/m   Physical Exam Vitals and nursing note reviewed.  Constitutional:      General: She is not in acute distress.    Appearance: She is well-developed.  HENT:     Head: Normocephalic and atraumatic.     Nose: Nose normal.  Eyes:     General: No scleral icterus.       Right eye: No  discharge.        Left eye: No discharge.     Conjunctiva/sclera: Conjunctivae normal.  Cardiovascular:     Rate and Rhythm: Normal rate and regular rhythm.     Heart sounds: Normal heart sounds. No murmur. No friction rub. No gallop.   Pulmonary:     Effort: Pulmonary effort is normal. No respiratory distress.     Breath sounds: Normal breath sounds.  Abdominal:     General: Bowel sounds are normal. There is no distension.     Palpations: Abdomen is soft.     Tenderness: There is no abdominal tenderness. There is no guarding.  Musculoskeletal:        General: Normal range of motion.     Cervical back: Normal range of motion and neck supple.  Skin:    General: Skin is warm and dry.     Findings: No rash.  Neurological:     Mental Status: She is alert.     Motor: No abnormal muscle  tone.     Coordination: Coordination normal.     ED Results / Procedures / Treatments   Labs (all labs ordered are listed, but only abnormal results are displayed) Labs Reviewed  COMPREHENSIVE METABOLIC PANEL - Abnormal; Notable for the following components:      Result Value   Alkaline Phosphatase 31 (*)    Total Bilirubin 1.5 (*)    All other components within normal limits  CBC - Abnormal; Notable for the following components:   RBC 2.69 (*)    Hemoglobin 3.7 (*)    HCT 15.6 (*)    MCV 58.0 (*)    MCH 13.8 (*)    MCHC 23.7 (*)    RDW 23.1 (*)    Platelets 601 (*)    nRBC 0.4 (*)    All other components within normal limits  SARS CORONAVIRUS 2 (TAT 6-24 HRS)  VITAMIN B12  FOLATE  IRON AND TIBC  FERRITIN  RETICULOCYTES  DIFFERENTIAL  CBC WITH DIFFERENTIAL/PLATELET  I-STAT BETA HCG BLOOD, ED ([facility], [facility], [facility] ONLY)  TYPE AND SCREEN  PREPARE RBC (CROSSMATCH)  ABO/RH    EKG None  Radiology No results found.  Procedures .Critical Care Performed by: Delia Heady, PA-C Authorized by: Delia Heady, PA-C   Critical care provider statement:    Critical care time (minutes):  35   Critical care was necessary to treat or prevent imminent or life-threatening deterioration of the following conditions:  Cardiac failure, circulatory failure and CNS failure or compromise   Critical care was time spent personally by me on the following activities:  Development of treatment plan with patient or surrogate, discussions with consultants, evaluation of patient's response to treatment, examination of patient, review of old charts, re-evaluation of patient's condition, ordering and review of radiographic studies, ordering and review of laboratory studies, ordering and performing treatments and interventions, pulse oximetry and obtaining history from patient or surrogate   I assumed direction of critical care for this patient from another provider in my specialty: no     (including critical  care time)  Medications Ordered in ED Medications  0.9 %  sodium chloride infusion (has no administration in time range)    ED Course  I have reviewed the triage vital signs and the nursing notes.  Pertinent labs & imaging results that were available during my care of the patient were reviewed by me and considered in my medical decision making (see chart for details).  Clinical Course as  of Jan 14 1913  Sat Jan 15, 2020  1901 Hemoglobin(!!): 3.7 [HK]    Clinical Course User Index [HK] Delia Heady, [place]   MDM Rules/Calculators/A&P                      34 year old female presenting to the ED at the request of her PCP for hemoglobin of 3.6 checked on lab work yesterday.  Patient establish care with her PCP yesterday for initial visit.  Patient denies any symptoms, denies any shortness of breath, lightheadedness, chest pain or dizziness.  She does not endorse heavy menstrual cycle or rectal bleeding.  She does note that she does not eat a lot of red meat so she believes that "my iron is probably low."  She has taken iron supplements in the past but only at her own discretion.  On exam patient is overall well-appearing.  She is able to speak without difficulty, able to ambulate without difficulty.  I reviewed lab work from yesterday's visit which did show hemoglobin of 3.6.  This was repleted today, hemoglobin of 3. again noted on today's lab work.  I have obtained an anemia panel as well as a differential on her.  We will transfuse 2 units and have her admitted to medicine service for further management and work-up of her anemia of unknown chronicity.  Patient remains hemodynamically stable today.   Portions of this note were generated with [employer]. Dictation errors may occur despite best attempts at proofreading.  Final Clinical Impression(s) / ED Diagnoses Final diagnoses:  Anemia, unspecified type    Rx / DC Orders ED Discharge Orders    None       Delia Heady, PA-C 01/15/20 1914    Lucrezia Starch, MD 01/16/20 1729

## 2020-01-15 NOTE — Telephone Encounter (Signed)
Patient with abnormal/critical Hgb of 3.6 on recent blood work and call placed to patient and message left that she had abnormal blood work results and needs to go to the ED as soon as she receives the results.

## 2020-01-15 NOTE — ED Notes (Signed)
Attempted report x1. 

## 2020-01-15 NOTE — ED Notes (Signed)
Patient receiving RBC Blood at this time.

## 2020-01-15 NOTE — ED Triage Notes (Signed)
Pt had blood work done yesterday, was called today to go to ED d/t hgb 3.5  States "I think it is my diet".

## 2020-01-15 NOTE — ED Notes (Signed)
Bed request changed to 5C from 6N. Attempted report again x1.

## 2020-01-16 ENCOUNTER — Observation Stay (HOSPITAL_COMMUNITY): Payer: Self-pay

## 2020-01-16 DIAGNOSIS — Z8041 Family history of malignant neoplasm of ovary: Secondary | ICD-10-CM

## 2020-01-16 DIAGNOSIS — R1900 Intra-abdominal and pelvic swelling, mass and lump, unspecified site: Secondary | ICD-10-CM

## 2020-01-16 LAB — HIV ANTIBODY (ROUTINE TESTING W REFLEX): HIV Screen 4th Generation wRfx: NONREACTIVE

## 2020-01-16 LAB — BASIC METABOLIC PANEL
Anion gap: 11 (ref 5–15)
BUN: 8 mg/dL (ref 6–20)
CO2: 21 mmol/L — ABNORMAL LOW (ref 22–32)
Calcium: 9.4 mg/dL (ref 8.9–10.3)
Chloride: 106 mmol/L (ref 98–111)
Creatinine, Ser: 0.68 mg/dL (ref 0.44–1.00)
GFR calc Af Amer: >60 mL/min (ref >60)
GFR calc non Af Amer: >60 mL/min (ref >60)
Glucose, Bld: 95 mg/dL (ref 70–99)
Potassium: 4 mmol/L (ref 3.5–5.1)
Sodium: 138 mmol/L (ref 135–145)

## 2020-01-16 LAB — TRANSFERRIN: Transferrin: 342 mg/dL (ref 192–382)

## 2020-01-16 LAB — CBC
HCT: 26.9 % — ABNORMAL LOW (ref 36.0–46.0)
Hemoglobin: 8 g/dL — ABNORMAL LOW (ref 12.0–15.0)
MCH: 20.7 pg — ABNORMAL LOW (ref 26.0–34.0)
MCHC: 29.7 g/dL — ABNORMAL LOW (ref 30.0–36.0)
MCV: 69.5 fL — ABNORMAL LOW (ref 80.0–100.0)
Platelets: 522 10*3/uL — ABNORMAL HIGH (ref 150–400)
RBC: 3.87 MIL/uL (ref 3.87–5.11)
RDW: 30.4 % — ABNORMAL HIGH (ref 11.5–15.5)
WBC: 6.3 10*3/uL (ref 4.0–10.5)
nRBC: 0.5 % — ABNORMAL HIGH (ref 0.0–0.2)

## 2020-01-16 LAB — HEMOGLOBIN AND HEMATOCRIT, BLOOD
HCT: 23.7 % — ABNORMAL LOW (ref 36.0–46.0)
Hemoglobin: 6.7 g/dL — CL (ref 12.0–15.0)

## 2020-01-16 LAB — OCCULT BLOOD X 1 CARD TO LAB, STOOL: Fecal Occult Bld: NEGATIVE

## 2020-01-16 LAB — PREPARE RBC (CROSSMATCH)

## 2020-01-16 MED ORDER — FERROUS SULFATE 325 (65 FE) MG PO TABS: 325.0000 mg | ORAL_TABLET | Freq: Every day | ORAL | 0 refills | Status: DC

## 2020-01-16 MED ORDER — SODIUM CHLORIDE 0.9% IV SOLUTION: Freq: Once | INTRAVENOUS | Status: DC

## 2020-01-16 NOTE — Progress Notes (Signed)
FPTS Interim Progress Note  S: Patient doing well. Post transfusion Hgb is 8. Pelvic US with large fibroids.  O: BP 108/63 (BP Location: Left Arm)   Pulse 76   Temp 98.1 F (36.7 C) (Oral)   Resp 16   Ht 5\' 4"  (1.626 m)   Wt 85.3 kg   LMP 01/02/2020 (Exact Date)   SpO2 100%   BMI 32.27 kg/m     A/P: Anemia 2/2 uterine leiomyoma Large, 12cm leiomyoma found on pelvic US. Most likely source of insidious bleeding that caused Hgb of 3.7. Patient received 3U pRBCs, Hgb now appropriate at 8. Discussed diagnosis and plan for future management. Patient feels well and is ready to go home. - Ambulatory referral to OB/GYN to discuss options on treatment of leiomyoma - F/U with PCP to check hgb later this week - Daily fe supplement - Discharge immenent  Gladys Damme, MD 01/16/2020, 1:17 PM PGY-1, [place] Medicine Service pager [phone]

## 2020-01-16 NOTE — Discharge Summary (Signed)
[facility] Discharge Summary  Patient name: Monique Bowman Medical record number: [id number] Date of birth: 1985/10/01 Age: 34 y.o. Gender: female Date of Admission: 01/15/2020  Date of Discharge: Jan 16, 2020 Admitting Physician: Sherene Sires, DO  Primary Care Provider: Antony Blackbird, MD Consultants: None  Indication for Hospitalization: Anemia  Discharge Diagnoses/Problem List:  Leiomyoma Iron deficiency anemia Hyperbilirubinemia Obesity  Disposition: To home  Discharge Condition: Stable and improved  Discharge Exam:  Today's Vitals   01/16/20 0912 01/16/20 1120 01/16/20 1215 01/16/20 1219  BP: 117/70   108/63  Pulse: 75   76  Resp: 18   16  Temp: 98.5 F (36.9 C)   98.1 F (36.7 C)  TempSrc: Oral   Oral  SpO2: 100%   100%  Weight:      Height:      PainSc:  0-No pain 0-No pain    Body mass index is 32.27 kg/m. Physical Exam: General: Alert, pleasant, no distress Cardiovascular: Regular rate and rhythm, still with systolic murmur Respiratory: Clear to auscultation bilaterally, no increased work of breathing, no cough, no wheeze/crackles Abdomen: No tenderness to palpation, still with midline lower abdominal palpable fullness/mass Extremities: Can move around bed spontaneously, no deficits noted  Brief Hospital Course:  Patient was admitted after outpatient hemoglobin was found to be 3.7.  She had been complaining of some increased exercise intolerance at the gym but had stable vital signs on admission.  She claims only minor menses bleeding and no indication of urinary or bowel concerns.  No known medical history.  Hemoglobin responded well overnight to 6.7 after 2 units transfusion PRBC and a third unit was ordered morning of 5/9 which improved hemoglobin to 8.0.  There was a palpable abdominal fullness which was evaluated with ultrasound and found to be leiomyoma, largest size 12 cm, most likely the cause for insidious anemia.   Issues  for Follow Up:  -Lab work-up did show iron deficiency anemia, she received 3units pRBC in the hospital and was discharged on oral iron.  Consider transfusion in 3 to 8 days of Feraheme if you believe this is appropriate. -Patient did have slightly elevated bilirubin to 1.5.  Please follow-up for resolution -Admission UA showed ketonuria to 20, likely due to anemia please consider recheck as outpatient -Most likely etiology of anemia is gynecologic in nature. Ambulatory referral to OB/GYN placed for further management of leiomyoma. - Patient's mother has h/o ovarian and breast cancer, her aunt and granmother also had breast and ovarian cancer. Mother is not sure if she was tested for BrCa. Consider BrCa testing in patient with 1st degree relative with ovarian and breast cancer.  Significant Procedures: transfused 3U pRBCs  Significant Labs and Imaging:  Recent Labs  Lab 01/14/20 1444 01/15/20 1753 01/16/20 0414 01/16/20 1047  WBC 7.7 6.8  6.9  --  6.3  HGB 3.6* 3.7*  3.7* 6.7* 8.0*  HCT 14.5* 15.4*  15.6* 23.7* 26.9*  PLT 600* 629*  601*  --  522*   Recent Labs  Lab 01/14/20 1444 01/14/20 1444 01/15/20 1753 01/16/20 0414  NA 137  --  135 138  K 4.6   < > 3.9 4.0  CL 103  --  103 106  CO2 22  --  22 21*  GLUCOSE 93  --  96 95  BUN 15  --  12 8  CREATININE 0.71  --  0.71 0.68  CALCIUM 9.3  --  9.5 9.4  ALKPHOS  --   --  31*  --   AST  --   --  16  --   ALT  --   --  11  --   ALBUMIN  --   --  4.0  --    < > = values in this interval not displayed.    US Abdomen Complete  Result Date: 01/16/2020 CLINICAL DATA:  Abdominal mass EXAM: ABDOMEN ULTRASOUND COMPLETE COMPARISON:  Same day pelvic ultrasound FINDINGS: Gallbladder: No gallstones or wall thickening visualized. No sonographic Murphy sign noted by sonographer. Common bile duct: Diameter: Normal at 3 mm Liver: No focal lesion identified. Within normal limits in parenchymal echogenicity. Portal vein is patent on color  Doppler imaging with normal direction of blood flow towards the liver. IVC: No abnormality visualized. Pancreas: Visualized portion unremarkable. Spleen: Size and appearance within normal limits. Right Kidney: Length: 11.4 cm. Echogenicity within normal limits. No mass or hydronephrosis visualized. Left Kidney: Length: 11.3 cm. Echogenicity within normal limits. No mass or hydronephrosis visualized. Abdominal aorta: No aneurysm visualized. Other findings: No ascites IMPRESSION: Normal abdominal ultrasound. Electronically Signed   By: Suzy Bouchard M.D.   On: 01/16/2020 10:07   US PELVIS (TRANSABDOMINAL ONLY)  Result Date: 01/16/2020 CLINICAL DATA:  Abdominal fullness and bloating.  Palpable mass. EXAM: TRANSABDOMINAL ULTRASOUND OF PELVIS TECHNIQUE: Transabdominal ultrasound examination of the pelvis was performed including evaluation of the uterus, ovaries, adnexal regions, and pelvic cul-de-sac. COMPARISON:  None. FINDINGS: Uterus Measurements: 21.3 x 8.9 x 13.6 cm = volume: 1340 mL. Several round heterogeneous low echogenicity masses are noted within the uterine body. Largest mass measures 12 cm. Two additional masses measure 5.8 and 4.4 cm. Findings consistent leiomyoma. Endometrium Thickness: 9.8 mm. Endometrium difficult to assess on transabdominal evaluation. No focal abnormality identified. Right ovary Measurements: 3.7 x 3.7 x 4.0 cm = volume: 28 mL. 2.9 cm cyst associated with the RIGHT ovary. Cyst is anechoic. Left ovary Measurements: 1.8 x 3.1 x 2.2 cm = volume: 6.6 mL. Normal Other findings:  No abnormal free fluid. IMPRESSION: 1. Multiple large uterine leiomyoma. 2. Endometrium difficult assess by transabdominal ultrasound but no abnormality identified. 3. Normal ovaries.  Functional ovarian cyst of the RIGHT ovary. Electronically Signed   By: Suzy Bouchard M.D.   On: 01/16/2020 09:58   Results/Tests Pending at Time of Discharge: none  Discharge Medications:  Allergies as of 01/16/2020   No  Known Allergies     Medication List    STOP taking these medications   ibuprofen 200 MG tablet Commonly known as: ADVIL     TAKE these medications   ferrous sulfate 325 (65 FE) MG tablet Take 1 tablet (325 mg total) by mouth daily.       Discharge Instructions: Please refer to Patient Instructions section of EMR for full details.  Patient was counseled important signs and symptoms that should prompt return to medical care, changes in medications, dietary instructions, activity restrictions, and follow up appointments.   Follow-Up Appointments: Follow-up Information    Fulp, Cammie, MD. Schedule an appointment as soon as possible for a visit.   Specialty: Family Medicine Why: Make appt later this week to have blood count checked Contact information: [place] [address] [phone]           Gladys Damme, MD 01/16/2020, 1:45 PM PGY-1, [place]

## 2020-01-16 NOTE — Discharge Instructions (Signed)
While in the hospital you were treated for anemia, or low hemoglobin (blood count). We gave you blood, and now your count is in a normal range (>7). Please take an iron supplement every day and drink orange juice (not in a clear container-- light will deactivate Vit C) to help maximally absorb the iron. Please follow up with your primary care provider this week to check to make sure your blood count is within normal range.  We did an ultrasound which showed you have uterine fibroids, which is the most likely cause of your anemia. Please follow up with an OB/GYN, we have referred you to one. Check with your mother's records or your aunt's medical records if you know that your family has BrCa (the breast cancer gene). This will be good information to give your primary care provider (PCP) and OB/GYN.  If you have dizziness, weakness, heart palpitations, heart beating fast, or pass out, please go to your nearest emergency room to be evaluated for low blood count.   Uterine Fibroids  Uterine fibroids (leiomyomas) are noncancerous (benign) tumors that can develop in the uterus. Fibroids may also develop in the fallopian tubes, cervix, or tissues (ligaments) near the uterus. You may have one or many fibroids. Fibroids vary in size, weight, and where they grow in the uterus. Some can become quite large. Most fibroids do not require medical treatment. What are the causes? The cause of this condition is not known. What increases the risk? You are more likely to develop this condition if you:  Are in your 30s or 40s and have not gone through menopause.  Have a family history of this condition.  Are of African-American descent.  Had your first period at an early age (early menarche).  Have not had any children (nulliparity).  Are overweight or obese. What are the signs or symptoms? Many women do not have any symptoms. Symptoms of this condition may include:  Heavy menstrual bleeding.  Bleeding or  spotting between periods.  Pain and pressure in the pelvic area, between the hips.  Bladder problems, such as needing to urinate urgently or more often than usual.  Inability to have children (infertility).  Failure to carry pregnancy to term (miscarriage). How is this diagnosed? This condition may be diagnosed based on:  Your symptoms and medical history.  A physical exam.  A pelvic exam that includes feeling for any tumors.  Imaging tests, such as ultrasound or MRI. How is this treated? Treatment for this condition may include:  Seeing your health care provider for follow-up visits to monitor your fibroids for any changes.  Taking NSAIDs such as ibuprofen, naproxen, or aspirin to reduce pain.  Hormone medicines. These may be taken as a pill, given in an injection, or delivered by a T-shaped device that is inserted into the uterus (intrauterine device, IUD).  Surgery to remove one of the following: ? The fibroids (myomectomy). Your health care provider may recommend this if fibroids affect your fertility and you want to become pregnant. ? The uterus (hysterectomy). ? Blood supply to the fibroids (uterine artery embolization). Follow these instructions at home:  Take over-the-counter and prescription medicines only as told by your health care provider.  Ask your health care provider if you should take iron pills or eat more iron-rich foods, such as dark green, leafy vegetables. Heavy menstrual bleeding can cause low iron levels.  If directed, apply heat to your back or abdomen to reduce pain. Use the heat source that your health  care provider recommends, such as a moist heat pack or a heating pad. ? Place a towel between your skin and the heat source. ? Leave the heat on for 20-30 minutes. ? Remove the heat if your skin turns bright red. This is especially important if you are unable to feel pain, heat, or cold. You may have a greater risk of getting burned.  Pay close  attention to your menstrual cycle. Tell your health care provider about any changes, such as: ? Increased blood flow that requires you to use more pads or tampons than usual. ? A change in the number of days that your period lasts. ? A change in symptoms that are associated with your period, such as back pain or cramps in your abdomen.  Keep all follow-up visits as told by your health care provider. This is important, especially if your fibroids need to be monitored for any changes. Contact a health care provider if you:  Have pelvic pain, back pain, or cramps in your abdomen that do not get better with medicine or heat.  Develop new bleeding between periods.  Have increased bleeding during or between periods.  Feel unusually tired or weak.  Feel light-headed. Get help right away if you:  Faint.  Have pelvic pain that suddenly gets worse.  Have severe vaginal bleeding that soaks a tampon or pad in 30 minutes or less. Summary  Uterine fibroids are noncancerous (benign) tumors that can develop in the uterus.  The exact cause of this condition is not known.  Most fibroids do not require medical treatment unless they affect your ability to have children (fertility).  Contact a health care provider if you have pelvic pain, back pain, or cramps in your abdomen that do not get better with medicines.  Make sure you know what symptoms should cause you to get help right away. This information is not intended to replace advice given to you by your health care provider. Make sure you discuss any questions you have with your health care provider. Document Revised: 08/08/2017 Document Reviewed: 07/22/2017 Elsevier Patient Education  2020 [employer].

## 2020-01-16 NOTE — Progress Notes (Signed)
CRITICAL VALUE ALERT  Critical Value:  Hemoglobin 6.7  Date & Time Notied:  01/16/2020 0515  Provider Notified: Dr Criss Rosales  Orders Received/Actions taken:  None

## 2020-01-16 NOTE — Hospital Course (Addendum)
Patient was admitted after outpatient hemoglobin was found to be 3.7.  She had been complaining of some increased exercise intolerance at the gym but had stable vital signs on admission.  She claims only minor menses bleeding and no indication of urinary or bowel concerns.  No known medical history.  Hemoglobin responded well overnight to 6.7 after 2 units transfusion PRBC and a third unit was ordered morning of 5/9 which improved hemoglobin to ***    There was a palpable abdominal fullness which was evaluated with ultrasound and found to be ***     Follow-up items: -Lab work-up did show iron deficiency anemia, she received ***units PRBC in the hospital and was discharged on oral iron ***.  Consider transfusion in 3 to 8 days of Feraheme if you believe this is appropriate. -Patient did have slightly elevated bilirubin to 1.5.  Please follow-up for resolution -Admission UA showed ketonuria to 20, likely due to anemia please consider recheck as outpatient -***Abdominal ultrasound showed ***

## 2020-01-16 NOTE — Progress Notes (Addendum)
Family Medicine Teaching Service Daily Progress Note Intern Pager: [phone]  Patient name: Monique Bowman Medical record number: [id number] Date of birth: May 31, 1986 Age: 34 y.o. Gender: female  Primary Care Provider: Antony Blackbird, MD Consultants:  Code Status: Full  Pt Overview and Major Events to Date:  5/8 admitted, transfused 2 units PRBC 5/9 ordered additional (third) unit PRBC transfusion  Assessment and Plan: Patient is a 34 year old female with minor symptoms of anemia with hemoglobin 3.7.  She is not on any chronic medication and denies any known medical history.  Symptomatic microcytic anemia with iron deficiency: Exact cause of iron deficiency at this time is unknown.  Minor symptoms of exercise intolerance prior to hemoglobin 3.7, improved to 6.7 after 2 units PRBC.  No known history but patient had recently stopped taking her [employer] iron supplementation.  Vital signs of been relatively stable, minor intermittent tachycardia to low 100s and tachypnea to low 20s charted although patient says she has felt fine.  Palpable lower abdominal fullness without pain.  UA did show hemoglobin on dipstick but patient is on her menses, FOBT pending.  Denies any dietary problems -Ordered additional 1 unit PRBC transfusion morning 5/9, follow-up H&H -Follow-up abdominal ultrasound -Follow-up FOBT -If all work-up negative, patient may be appropriate for outpatient follow-up third unit PRBC and potential Feraheme infusion in 3 to 8 days  Abdominal fullness: Palpable fullness to abdomen is chronic per patient, no pain or tenderness or inability to eat.  Patient is currently on her menses which could have some influence on this but ultrasound is pending.  I do have concern for malignancy given family history of ovarian cancer and abnormally low hemoglobin 3.7. -Follow-up abdominal ultrasound  Ketonuria: 20 on admission UA.  At this point likely due to significant anemia as no  hyperglycemia -Will need recheck later once she is stabilized for resolution, potentially outpatient   FEN/GI: clear liquid pending abdominal ultrasound PPx: SCDs  Disposition: MedSurg  Subjective:  Patient said she is not feeling any worse symptoms but as she did not sleep very well last night she is unsure if she has any extra energy or strength.  No complaints and consents to her third unit PRBC  Objective: Temp:  [97.9 F (36.6 C)-99.3 F (37.4 C)] 97.9 F (36.6 C) (05/09 0535) Pulse Rate:  [74-117] 74 (05/09 0535) Resp:  [11-24] 16 (05/09 0535) BP: (89-125)/(51-83) 107/64 (05/09 0535) SpO2:  [100 %] 100 % (05/09 0535) Weight:  [85.3 kg] 85.3 kg (05/08 1756) Physical Exam: General: Alert, pleasant, no distress Cardiovascular: Regular rate and rhythm, still with systolic murmur Respiratory: Clear to auscultation bilaterally, no increased work of breathing, no cough, no wheeze/crackles Abdomen: No tenderness to palpation, still with midline lower abdominal palpable fullness/mass Extremities: Can move around bed spontaneously, no deficits noted  Laboratory: Recent Labs  Lab 01/14/20 1444 01/15/20 1753 01/16/20 0414  WBC 7.7 6.8  6.9  --   HGB 3.6* 3.7*  3.7* 6.7*  HCT 14.5* 15.4*  15.6* 23.7*  PLT 600* 629*  601*  --    Recent Labs  Lab 01/14/20 1444 01/15/20 1753 01/16/20 0414  NA 137 135 138  K 4.6 3.9 4.0  CL 103 103 106  CO2 22 22 21*  BUN 15 12 8   CREATININE 0.71 0.71 0.68  CALCIUM 9.3 9.5 9.4  PROT  --  6.8  --   BILITOT  --  1.5*  --   ALKPHOS  --  31*  --   ALT  --  11  --   AST  --  16  --   GLUCOSE 93 96 95    Imaging/Diagnostic Tests: No results found.   Sherene Sires, DO 01/16/2020, 5:41 AM PGY-3, [place] Intern pager: [phone], text pages welcome

## 2020-01-16 NOTE — Progress Notes (Signed)
Pt discharge education provided at bedside  Pt has all belongings  Pt IV removed, catheter intact  Pt denies wheelchair, pt ambulated off unit with RN

## 2020-01-17 ENCOUNTER — Telehealth: Payer: Self-pay

## 2020-01-17 LAB — TYPE AND SCREEN
ABO/RH(D): O POS
Antibody Screen: NEGATIVE
Unit division: 0
Unit division: 0
Unit division: 0

## 2020-01-17 LAB — BPAM RBC
Blood Product Expiration Date: 202106092359
Blood Product Expiration Date: 202106102359
Blood Product Expiration Date: 202106102359
ISSUE DATE / TIME: 202105082035
ISSUE DATE / TIME: 202105082323
ISSUE DATE / TIME: 202105090645
Unit Type and Rh: 5100
Unit Type and Rh: 5100
Unit Type and Rh: 5100

## 2020-01-17 NOTE — Telephone Encounter (Signed)
Transition Care Management Follow-up Telephone Call  Date of discharge and from where: 01/16/2020  How have you been since you were released from the hospital? Good  Any questions or concerns? None  Items Reviewed:  Did the pt receive and understand the discharge instructions provided? YES   Medications obtained and verified?  Pt did not obtained meds yet. veriied w pt   Any new allergies since your discharge?  NONE  Dietary orders reviewed?  YES . Education was provided  Do you have support at home?  Yes family  Functional Questionnaire: (I = Independent and D = Dependent) ADLs: I     Follow up appointments reviewed:   PCP Hospital f/u appt confirmed?Scheduled to see Fulp  on 01/20/2020 @ 8:30am.  [facility] f/u appt confirmed?   Not yet scheduled/ referred to OBGYN  Are transportation arrangements needed? No   If their condition worsens, is the pt aware to call PCP or go to the Emergency Dept.?MADE PT AWARE   Was the patient provided with contact information for the PCP's office or ED?YES  Was to pt encouraged to call back with questions or concerns?YES phone nr and contact info provided

## 2020-01-18 ENCOUNTER — Encounter: Payer: Self-pay | Admitting: Family Medicine

## 2020-01-18 DIAGNOSIS — D500 Iron deficiency anemia secondary to blood loss (chronic): Secondary | ICD-10-CM | POA: Insufficient documentation

## 2020-01-18 DIAGNOSIS — D259 Leiomyoma of uterus, unspecified: Secondary | ICD-10-CM | POA: Insufficient documentation

## 2020-01-19 NOTE — Progress Notes (Deleted)
Patient ID: Monique Bowman, female   DOB: [birth year], 34 y.o.   MRN: [id number]   After hospitalization for anemia 5/8-01/16/2020.  From discharge summary: Brief Hospital Course:  Patient was admitted after outpatient hemoglobin was found to be 3.7.  She had been complaining of some increased exercise intolerance at the gym but had stable vital signs on admission.  She claims only minor menses bleeding and no indication of urinary or bowel concerns.  No known medical history.  Hemoglobin responded well overnight to 6.7 after 2 units transfusion PRBC and a third unit was ordered morning of 5/9 which improved hemoglobin to 8.0.  There was a palpable abdominal fullness which was evaluated with ultrasound and found to be leiomyoma, largest size 12 cm, most likely the cause for insidious anemia.   Issues for Follow Up:  -Lab work-up did show iron deficiency anemia, she received 3units pRBC in the hospital and was discharged on oral iron.  Consider transfusion in 3 to 8 days of Feraheme if you believe this is appropriate. -Patient did have slightly elevated bilirubin to 1.5.  Please follow-up for resolution -Admission UA showed ketonuria to 20, likely due to anemia please consider recheck as outpatient -Most likely etiology of anemia is gynecologic in nature. Ambulatory referral to OB/GYN placed for further management of leiomyoma. - Patient's mother has h/o ovarian and breast cancer, her aunt and granmother also had breast and ovarian cancer. Mother is not sure if she was tested for BrCa. Consider BrCa testing in patient with 1st degree relative with ovarian and breast cancer.

## 2020-01-20 ENCOUNTER — Ambulatory Visit: Payer: Managed Care, Other (non HMO) | Attending: Family Medicine

## 2020-01-20 ENCOUNTER — Other Ambulatory Visit: Payer: Self-pay

## 2020-01-27 ENCOUNTER — Encounter: Payer: Self-pay | Admitting: Family Medicine

## 2020-01-27 ENCOUNTER — Other Ambulatory Visit: Payer: Self-pay

## 2020-01-27 ENCOUNTER — Ambulatory Visit: Payer: Self-pay | Attending: Family Medicine | Admitting: Family Medicine

## 2020-01-27 VITALS — BP 117/75 | HR 82 | Temp 98.1°F | Ht 64.0 in | Wt 183.8 lb

## 2020-01-27 DIAGNOSIS — Z8041 Family history of malignant neoplasm of ovary: Secondary | ICD-10-CM

## 2020-01-27 DIAGNOSIS — R5383 Other fatigue: Secondary | ICD-10-CM

## 2020-01-27 DIAGNOSIS — D259 Leiomyoma of uterus, unspecified: Secondary | ICD-10-CM

## 2020-01-27 DIAGNOSIS — Z9289 Personal history of other medical treatment: Secondary | ICD-10-CM

## 2020-01-27 DIAGNOSIS — Z09 Encounter for follow-up examination after completed treatment for conditions other than malignant neoplasm: Secondary | ICD-10-CM

## 2020-01-27 DIAGNOSIS — D649 Anemia, unspecified: Secondary | ICD-10-CM

## 2020-01-27 NOTE — Patient Instructions (Signed)
Anemia  Anemia is a condition in which you do not have enough red blood cells or hemoglobin. Hemoglobin is a substance in red blood cells that carries oxygen. When you do not have enough red blood cells or hemoglobin (are anemic), your body cannot get enough oxygen and your organs may not work properly. As a result, you may feel very tired or have other problems. What are the causes? Common causes of anemia include:  Excessive bleeding. Anemia can be caused by excessive bleeding inside or outside the body, including bleeding from the intestine or from periods in women.  Poor nutrition.  Long-lasting (chronic) kidney, thyroid, and liver disease.  Bone marrow disorders.  Cancer and treatments for cancer.  HIV (human immunodeficiency virus) and AIDS (acquired immunodeficiency syndrome).  Treatments for HIV and AIDS.  Spleen problems.  Blood disorders.  Infections, medicines, and autoimmune disorders that destroy red blood cells. What are the signs or symptoms? Symptoms of this condition include:  Minor weakness.  Dizziness.  Headache.  Feeling heartbeats that are irregular or faster than normal (palpitations).  Shortness of breath, especially with exercise.  Paleness.  Cold sensitivity.  Indigestion.  Nausea.  Difficulty sleeping.  Difficulty concentrating. Symptoms may occur suddenly or develop slowly. If your anemia is mild, you may not have symptoms. How is this diagnosed? This condition is diagnosed based on:  Blood tests.  Your medical history.  A physical exam.  Bone marrow biopsy. Your health care provider may also check your stool (feces) for blood and may do additional testing to look for the cause of your bleeding. You may also have other tests, including:  Imaging tests, such as a CT scan or MRI.  Endoscopy.  Colonoscopy. How is this treated? Treatment for this condition depends on the cause. If you continue to lose a lot of blood, you may  need to be treated at a hospital. Treatment may include:  Taking supplements of iron, vitamin S31, or folic acid.  Taking a hormone medicine (erythropoietin) that can help to stimulate red blood cell growth.  Having a blood transfusion. This may be needed if you lose a lot of blood.  Making changes to your diet.  Having surgery to remove your spleen. Follow these instructions at home:  Take over-the-counter and prescription medicines only as told by your health care provider.  Take supplements only as told by your health care provider.  Follow any diet instructions that you were given.  Keep all follow-up visits as told by your health care provider. This is important. Contact a health care provider if:  You develop new bleeding anywhere in the body. Get help right away if:  You are very weak.  You are short of breath.  You have pain in your abdomen or chest.  You are dizzy or feel faint.  You have trouble concentrating.  You have bloody or black, tarry stools.  You vomit repeatedly or you vomit up blood. Summary  Anemia is a condition in which you do not have enough red blood cells or enough of a substance in your red blood cells that carries oxygen (hemoglobin).  Symptoms may occur suddenly or develop slowly.  If your anemia is mild, you may not have symptoms.  This condition is diagnosed with blood tests as well as a medical history and physical exam. Other tests may be needed.  Treatment for this condition depends on the cause of the anemia. This information is not intended to replace advice given to you by  your health care provider. Make sure you discuss any questions you have with your health care provider. Document Revised: 08/08/2017 Document Reviewed: 09/27/2016 Elsevier Patient Education  [place].  Iron Deficiency Anemia, Adult Iron-deficiency anemia is when you have a low amount of red blood cells or hemoglobin. This happens because you have  too little iron in your body. Hemoglobin carries oxygen to parts of the body. Anemia can cause your body to not get enough oxygen. It may or may not cause symptoms. Follow these instructions at home: Medicines  Take over-the-counter and prescription medicines only as told by your doctor. This includes iron pills (supplements) and vitamins.  If you cannot handle taking iron pills by mouth, ask your doctor about getting iron through: ? A vein (intravenously). ? A shot (injection) into a muscle.  Take iron pills when your stomach is empty. If you cannot handle this, take them with food.  Do not drink milk or take antacids at the same time as your iron pills.  To prevent trouble pooping (constipation), eat fiber or take medicine (stool softener) as told by your doctor. Eating and drinking   Talk with your doctor before changing the foods you eat. He or she may tell you to eat foods that have a lot of iron, such as: ? Liver. ? Lowfat (lean) beef. ? Breads and cereals that have iron added to them (fortified breads and cereals). ? Eggs. ? Dried fruit. ? Dark green, leafy vegetables.  Drink enough fluid to keep your pee (urine) clear or pale yellow.  Eat fresh fruits and vegetables that are high in vitamin C. They help your body to use iron. Foods with a lot of vitamin C include: ? Oranges. ? Peppers. ? Tomatoes. ? Mangoes. General instructions  Return to your normal activities as told by your doctor. Ask your doctor what activities are safe for you.  Keep yourself clean, and keep things clean around you (your surroundings). Anemia can make you get sick more easily.  Keep all follow-up visits as told by your doctor. This is important. Contact a doctor if:  You feel sick to your stomach (nauseous).  You throw up (vomit).  You feel weak.  You are sweating for no clear reason.  You have trouble pooping, such as: ? Pooping (having a bowel movement) less than 3 times a  week. ? Straining to poop. ? Having poop that is hard, dry, or larger than normal. ? Feeling full or bloated. ? Pain in the lower belly. ? Not feeling better after pooping. Get help right away if:  You pass out (faint). If this happens, do not drive yourself to the hospital. Call your local emergency services (911 in the [place]  You have chest pain.  You have shortness of breath that: ? Is very bad. ? Gets worse with physical activity.  You have a fast heartbeat.  You get light-headed when getting up from sitting or lying down. This information is not intended to replace advice given to you by your health care provider. Make sure you discuss any questions you have with your health care provider. Document Revised: 08/08/2017 Document Reviewed: 05/15/2016 Elsevier Patient Education  [place].

## 2020-01-27 NOTE — Progress Notes (Signed)
HFU and to recheck her iron levels

## 2020-01-27 NOTE — Progress Notes (Signed)
Patient ID: Monique Bowman, female    DOB: 11-Mar-1986  MRN: [id number]   SUBJECTIVE:  Monique Bowman is a 34 y.o. female who presents for hospital f/u after admission on 01/15/2020 for symptomatic anemia after having blood work done at her new patient appointment on here on 01/14/2020 which revealed a hemoglobin of 3.6 with hematocrit of 14.5 for which patient was instructed to go to the emergency department for further evaluation and treatment.  She received 2 units of packed red blood cells on the first night with improvement of hemoglobin to 6.7 and then received an additional unit of packed red blood cells on the day of discharge with improvement of hemoglobin to 8.0.  Due to history of ovarian cancer and patient with abdominal fullness, she had a pelvic ultrasound showing uterine fibroids largest of which was 12 cm which was thought to be the most likely cause of her anemia due to insidious blood loss.         At today's visit, she states that she feels much better.  She has resumed her exercise program.  She reports however that she does have some minor fatigue and would like to have hemoglobin A1c checked.  She is taking iron replacement once daily.  She denies any nausea, abdominal pain, constipation or blood in the stools.  She does not feel as if her menses are especially heavy.  She denies any chest pain, palpitations and no shortness of breath.  Where: [facility] When: 01/15/2020-01/16/2020 Primary Dx: Symptomatic anemia, family history of ovarian cancer, palpable mass of pelvis with discharge diagnoses of iron deficiency anemia, leiomyoma, hyperbilirubinemia and obesity   Patient Active Problem List   Diagnosis Date Noted  . Iron deficiency anemia due to chronic blood loss 01/18/2020  . Uterine fibroid 01/18/2020  . Family history of ovarian cancer   . Palpable mass of pelvis   . Symptomatic anemia 01/15/2020  . Known health problems: none 01/14/2020     Current Outpatient  Medications on File Prior to Visit  Medication Sig Dispense Refill  . ferrous sulfate 325 (65 FE) MG tablet Take 1 tablet (325 mg total) by mouth daily. 90 tablet 0   No current facility-administered medications on file prior to visit.    No Known Allergies  Social History   Tobacco Use  . Smoking status: Never Smoker  . Smokeless tobacco: Never Used  Substance Use Topics  . Alcohol use: No  . Drug use: No     Family History  Problem Relation Age of Onset  . Diabetes Mother   . Breast cancer Mother        remission  . Ovarian cancer Mother        remission  . Congenital heart disease Brother        Died at age 14    Past Surgical History:  Procedure Laterality Date  . NO PAST SURGERIES      ROS: Review of Systems  Constitutional: Positive for fatigue (Improved).  HENT: Negative for sore throat and trouble swallowing.   Respiratory: Negative for cough and shortness of breath.   Cardiovascular: Negative for chest pain, palpitations and leg swelling.  Gastrointestinal: Negative for abdominal pain, blood in stool, constipation, diarrhea and nausea.  Endocrine: Negative for cold intolerance, heat intolerance, polydipsia, polyphagia and polyuria.  Genitourinary: Negative for dysuria and frequency.  Musculoskeletal: Negative for arthralgias and back pain.  Neurological: Negative for dizziness and headaches.  Hematological: Negative for adenopathy. Does not  bruise/bleed easily.     PHYSICAL EXAM: BP 117/75   Pulse 82   Temp 98.1 F (36.7 C) (Temporal)   Ht 5\' 4"  (1.626 m)   Wt 183 lb 12.8 oz (83.4 kg)   LMP 01/02/2020 (Exact Date)   SpO2 99%   BMI 31.55 kg/m    Physical Exam Vitals and nursing note reviewed.  Constitutional:      Appearance: Normal appearance.     Comments: Well-nourished well-developed overweight for height appearing female in no acute distress  Cardiovascular:     Rate and Rhythm: Normal rate and regular rhythm.  Pulmonary:     Effort:  Pulmonary effort is normal.     Breath sounds: Normal breath sounds.  Abdominal:     Tenderness: There is no abdominal tenderness. There is no right CVA tenderness, left CVA tenderness, guarding or rebound.  Musculoskeletal:        General: No tenderness.     Right lower leg: No edema.     Left lower leg: No edema.  Skin:    General: Skin is warm and dry.  Neurological:     General: No focal deficit present.     Mental Status: She is alert and oriented to person, place, and time.  Psychiatric:        Mood and Affect: Mood normal.        Behavior: Behavior normal.       ASSESSMENT AND PLAN: 1. Symptomatic anemia; 2.  History of blood transfusion; 3.  Hospital discharge follow-up Patient is status post hospitalization after her recent visit to establish care secondary to complaint of continued increased heart rate, fatigue and shortness of breath after exercise and her hemoglobin was found to be critically low at 3.6.  She received a total of 3 units of packed red blood cells during her recent hospitalization and is taking a daily iron supplement.  She was also found to have uterine fibroids which are the likely cause of her chronic blood loss.  She will have repeat CBC at today's visit to make sure that blood counts are stable as well as iron panel to make sure that she is having improvement in iron storage.  Referral is being placed to gynecology in follow-up of uterine fibroids. - CBC - Iron, TIBC and Ferritin Panel - Ambulatory referral to Gynecology   4. Fatigue, unspecified type She reports that she still has some fatigue.  Will repeat CBC as well as iron panel to make sure that iron counts are stable and that iron storage is improving status post recent hospitalization and transfusion.  Patient also repeatedly requested hemoglobin A1c and I discussed with patient that her blood sugars during her hospitalization were within normal but will obtain hemoglobin A1c and she will be  notified of the results.  She denies any symptoms of elevated blood sugar such as increased thirst, blurred vision or urinary frequency. - CBC - Iron, TIBC and Ferritin Panel - Hemoglobin A1c  5. Uterine leiomyoma, unspecified location 6. Family history of ovarian cancer She was found to have uterine fibroids as a likely cause of her symptomatic anemia from chronic blood loss.  She additionally has a family history significant for ovarian cancer.  She has been referred to gynecology for further evaluation and treatment. - Ambulatory referral to Gynecology  Future Appointments  Date Time Provider [place]  02/02/2020 10:30 AM [facility] FINANCIAL COUNSELOR [facility] None  02/04/2020  2:50 PM Calianna Kim, MD [facility] None  Antony Blackbird, MD, Rosalita Chessman

## 2020-01-28 LAB — IRON,TIBC AND FERRITIN PANEL
Ferritin: 11 ng/mL — ABNORMAL LOW (ref 15–150)
Iron Saturation: 12 % — ABNORMAL LOW (ref 15–55)
Iron: 48 ug/dL (ref 27–159)
Total Iron Binding Capacity: 402 ug/dL (ref 250–450)
UIBC: 354 ug/dL (ref 131–425)

## 2020-01-28 LAB — CBC
Hematocrit: 32.8 % — ABNORMAL LOW (ref 34.0–46.6)
Hemoglobin: 9.2 g/dL — ABNORMAL LOW (ref 11.1–15.9)
MCH: 20.4 pg — ABNORMAL LOW (ref 26.6–33.0)
MCHC: 28 g/dL — ABNORMAL LOW (ref 31.5–35.7)
MCV: 73 fL — ABNORMAL LOW (ref 79–97)
Platelets: 409 x10E3/uL (ref 150–450)
RBC: 4.52 x10E6/uL (ref 3.77–5.28)
WBC: 8.5 x10E3/uL (ref 3.4–10.8)

## 2020-01-28 LAB — HEMOGLOBIN A1C
Est. average glucose Bld gHb Est-mCnc: 100 mg/dL
Hgb A1c MFr Bld: 5.1 % (ref 4.8–5.6)

## 2020-02-02 ENCOUNTER — Ambulatory Visit: Payer: Managed Care, Other (non HMO)

## 2020-02-04 ENCOUNTER — Ambulatory Visit: Payer: Managed Care, Other (non HMO) | Admitting: Family Medicine

## 2020-02-11 ENCOUNTER — Ambulatory Visit: Payer: Medicaid Other | Admitting: Family

## 2020-02-18 NOTE — Progress Notes (Signed)
Patient ID: Monique Bowman, female    DOB: November 22, 1985  MRN: [id number]  CC: PAP  Subjective: Monique Bowman is a 34 y.o. female with history of uterine fibroid, symptomatic anemia, palpable mass of pelvis, and iron deficiency anemia due to chronic blood loss who presents for PAP.  1. PAP: Age at menarche:34 years old  LMP: 02/01/2020, reports does have spotting in between Menses duration, flow, frequency: 7 days, light/heavy flow, 1 period per month   Pregnancies: 0 Sexual concerns: denies Sexually active: no, reports she is a virgin Contraception use past, present: denies Previous STIs: denies Previous PID: denies Vaginal discharge: denies  Vaginal lesions: denies Pelvic pain: denies Uterine fibroids: yes, reports she is scheduled to follow-up with gynecology   Urinary incontinence: denies Bowel incontinence: denies Previous GYN procedures: denies Last PAP: reports never had a PAP Testing for HIV today? denies  2. SHORTNESS OF BREATH:  Last visit 01/27/2020 with Dr. Chapman Fitch. During that encounter patient was status post hospitalization related to complaint of continued increased heart rate, fatigue and shortness of breath after exercise with a critical low hemoglobin at 3.6. During visit with Dr. Chapman Fitch patient had repeat CBC to make sure blood count was stable and an iron panel to make sure she was improving in iron storage. Referral was made to gynecology to follow-up uterine fibroids.   During her hospitalization for shortness of breath she received 3 units of packed red blood cells and began taking daily iron supplement. Patient was found to have uterine fibroids which were the likely cause of her chronic blood loss.   Today reports she is without shortness of breath and chest pain. Reports since receiving blood transfusions in the hospital she is feeling much better. Reports she is no longer getting as exerted with exercise as she previously was.   Patient Active Problem List     Diagnosis Date Noted  . Iron deficiency anemia due to chronic blood loss 01/18/2020  . Uterine fibroid 01/18/2020  . Family history of ovarian cancer   . Palpable mass of pelvis   . Symptomatic anemia 01/15/2020  . Known health problems: none 01/14/2020     Current Outpatient Medications on File Prior to Visit  Medication Sig Dispense Refill  . ferrous sulfate 325 (65 FE) MG tablet Take 1 tablet (325 mg total) by mouth daily. 90 tablet 0   No current facility-administered medications on file prior to visit.    No Known Allergies  Social History   Socioeconomic History  . Marital status: Single    Spouse name: Not on file  . Number of children: Not on file  . Years of education: Not on file  . Highest education level: Not on file  Occupational History  . Not on file  Tobacco Use  . Smoking status: Never Smoker  . Smokeless tobacco: Never Used  Vaping Use  . Vaping Use: Never used  Substance and Sexual Activity  . Alcohol use: No  . Drug use: No  . Sexual activity: Never    Birth control/protection: None  Other Topics Concern  . Not on file  Social History Narrative  . Not on file   Social Determinants of Health   Financial Resource Strain:   . Difficulty of Paying Living Expenses:   Food Insecurity:   . Worried About Charity fundraiser in the Last Year:   . [place] in the Last Year:   Transportation Needs:   . Lack of  Transportation (Medical):   Marland Kitchen Lack of Transportation (Non-Medical):   Physical Activity:   . Days of Exercise per Week:   . Minutes of Exercise per Session:   Stress:   . Feeling of Stress :   Social Connections:   . Frequency of Communication with Friends and Family:   . Frequency of Social Gatherings with Friends and Family:   . Attends Religious Services:   . Active Member of Clubs or Organizations:   . Attends Archivist Meetings:   Marland Kitchen Marital Status:   Intimate Partner Violence:   . Fear of Current or Ex-Partner:    . Emotionally Abused:   Marland Kitchen Physically Abused:   . Sexually Abused:     Family History  Problem Relation Age of Onset  . Diabetes Mother   . Breast cancer Mother        remission  . Ovarian cancer Mother        remission  . Congenital heart disease Brother        Died at age 10    Past Surgical History:  Procedure Laterality Date  . NO PAST SURGERIES      ROS: Review of Systems Negative except as stated above  PHYSICAL EXAM: Vitals with BMI 02/21/2020 01/27/2020 01/16/2020  Height 5\' 4"  5\' 4"  -  Weight 179 lbs 183 lbs 13 oz -  BMI 41.28 78.67 -  Systolic 672 094 709  Diastolic 76 75 63  Pulse 91 82 76  SpO2- 99%, room air  Temperature- 97.89F, oral  Physical Exam General appearance - alert, well appearing, and in no distress and oriented to person, place, and time Mental status - alert, oriented to person, place, and time, normal mood, behavior, speech, dress, motor activity, and thought processes Neck - supple, no significant adenopathy Lymphatics - no palpable lymphadenopathy, no hepatosplenomegaly Chest - clear to auscultation, no wheezes, rales or rhonchi, symmetric air entry, no tachypnea, retractions or cyanosis Heart - normal rate, regular rhythm, normal S1, S2, no murmurs, rubs, clicks or gallops Breasts - breasts appear normal, no suspicious masses, no skin or nipple changes or axillary nodes, patient declines to have breast exam Pelvic - normal external genitalia, vulva, vagina, cervix, uterus and adnexa, VULVA: normal appearing vulva with no masses, tenderness or lesions, VAGINA: normal appearing vagina with normal color and no lesions, CERVIX: normal appearing cervix without lesions, cervical discharge present - brown and thin, WET MOUNT done - results: pending, DNA probe for chlamydia and GC obtained, nulliparous os, UTERUS: uterus is normal size, shape, consistency and nontender, ADNEXA: normal adnexa in size, nontender and no masses, no palpable internal organs,  PAP: Pap smear done today, thin-prep method, DNA probe for chlamydia and GC obtained, HPV test, WET MOUNT done - results: DNA probe for chlamydia and GC obtained, pending, exam chaperoned by Mariane Baumgarten, CMA  ASSESSMENT AND PLAN: 1. Cervical cancer screening: -PAP with HPV reflex and STI panel completed today. Patient nulliparous. This is her first PAP. - Cytology - PAP() - Cervicovaginal ancillary only  2. Uterine leiomyoma, unspecified location: -Patient with history of uterine fibroids. Patient was referred to Gynecology by her primary physician on 01/27/2020.  -Patient reports she has not been to the appointment yet but will make appointment soon.  -Encouraged patient to follow-up with Gynecology.   3. Family history of ovarian cancer: -Patient with family history of ovarian cancer in biological mother. Patient was referred to Gynecology by her primary physician on 01/27/2020.  -Patient reports  she has not been to the appointment yet but will make appointment soon.  -Encouraged patient to follow-up with Gynecology.    4. Family history of breast cancer in mother: -Patient has family history of biological mother with ovarian and breast cancer. -Patient declines breast exam on today. Counseled patient on the importance of self-exam of breast and to receive a mammogram around the age of 34 years old given her mother's history of breast and ovarian cancer. Patient agreeable.  5. Dyspnea on exertion:  -Today reports she is without shortness of breath and chest pain. Reports since receiving blood transfusions in the hospital on 01/15/2020 she is feeling much better. Reports she is no longer getting as exerted with exercise as she previously was.   Patient was given the opportunity to ask questions.  Patient verbalized understanding of the plan and was able to repeat key elements of the plan. Patient was given clear instructions to go to Emergency Department or return to medical center if  symptoms don't improve, worsen, or new problems develop.The patient verbalized understanding.  Camillia Herter, NP

## 2020-02-21 ENCOUNTER — Ambulatory Visit: Payer: Self-pay | Attending: Family Medicine | Admitting: Family

## 2020-02-21 ENCOUNTER — Other Ambulatory Visit: Payer: Self-pay

## 2020-02-21 ENCOUNTER — Other Ambulatory Visit (HOSPITAL_COMMUNITY)
Admission: RE | Admit: 2020-02-21 | Discharge: 2020-02-21 | Disposition: A | Discharge status: Home / Self Care | Payer: Medicaid Other | Source: Ambulatory Visit | Attending: Family | Admitting: Family

## 2020-02-21 ENCOUNTER — Encounter: Payer: Self-pay | Admitting: Family

## 2020-02-21 VITALS — BP 115/76 | HR 91 | Temp 97.7°F | Ht 64.0 in | Wt 179.0 lb

## 2020-02-21 DIAGNOSIS — R0600 Dyspnea, unspecified: Secondary | ICD-10-CM

## 2020-02-21 DIAGNOSIS — R0609 Other forms of dyspnea: Secondary | ICD-10-CM

## 2020-02-21 DIAGNOSIS — D259 Leiomyoma of uterus, unspecified: Secondary | ICD-10-CM

## 2020-02-21 DIAGNOSIS — R1900 Intra-abdominal and pelvic swelling, mass and lump, unspecified site: Secondary | ICD-10-CM | POA: Insufficient documentation

## 2020-02-21 DIAGNOSIS — Z803 Family history of malignant neoplasm of breast: Secondary | ICD-10-CM

## 2020-02-21 DIAGNOSIS — D500 Iron deficiency anemia secondary to blood loss (chronic): Secondary | ICD-10-CM | POA: Insufficient documentation

## 2020-02-21 DIAGNOSIS — Z124 Encounter for screening for malignant neoplasm of cervix: Secondary | ICD-10-CM

## 2020-02-21 DIAGNOSIS — Z8041 Family history of malignant neoplasm of ovary: Secondary | ICD-10-CM

## 2020-02-21 NOTE — Patient Instructions (Addendum)
PAP on today. Will call patient with results once available. Follow-up with Gynecology for management of Leiomyoma as previously counseled.  Pap Test Why am I having this test? A Pap test, also called a Pap smear, is a screening test to check for signs of:  Cancer of the vagina, cervix, and uterus. The cervix is the lower part of the uterus that opens into the vagina.  Infection.  Changes that may be a sign that cancer is developing (precancerous changes). Women need this test on a regular basis. In general, you should have a Pap test every 3 years until you reach menopause or age 70. Women aged 30-60 may choose to have their Pap test done at the same time as an HPV (human papillomavirus) test every 5 years (instead of every 3 years). Your health care provider may recommend having Pap tests more or less often depending on your medical conditions and past Pap test results. What kind of sample is taken?  Your health care provider will collect a sample of cells from the surface of your cervix. This will be done using a small cotton swab, plastic spatula, or brush. This sample is often collected during a pelvic exam, when you are lying on your back on an exam table with feet in footrests (stirrups). In some cases, fluids (secretions) from the cervix or vagina may also be collected. How do I prepare for this test?  Be aware of where you are in your menstrual cycle. If you are menstruating on the day of the test, you may be asked to reschedule.  You may need to reschedule if you have a known vaginal infection on the day of the test.  Follow instructions from your health care provider about: ? Changing or stopping your regular medicines. Some medicines can cause abnormal test results, such as digitalis and tetracycline. ? Avoiding douching or taking a bath the day before or the day of the test. Tell a health care provider about:  Any allergies you have.  All medicines you are taking, including  vitamins, herbs, eye drops, creams, and over-the-counter medicines.  Any blood disorders you have.  Any surgeries you have had.  Any medical conditions you have.  Whether you are pregnant or may be pregnant. How are the results reported? Your test results will be reported as either abnormal or normal. A false-positive result can occur. A false positive is incorrect because it means that a condition is present when it is not. A false-negative result can occur. A false negative is incorrect because it means that a condition is not present when it is. What do the results mean? A normal test result means that you do not have signs of cancer of the vagina, cervix, or uterus. An abnormal result may mean that you have:  Cancer. A Pap test by itself is not enough to diagnose cancer. You will have more tests done in this case.  Precancerous changes in your vagina, cervix, or uterus.  Inflammation of the cervix.  An STD (sexually transmitted disease).  A fungal infection.  A parasite infection. Talk with your health care provider about what your results mean. Questions to ask your health care provider Ask your health care provider, or the department that is doing the test:  When will my results be ready?  How will I get my results?  What are my treatment options?  What other tests do I need?  What are my next steps? Summary  In general, women should have  a Pap test every 3 years until they reach menopause or age 56.  Your health care provider will collect a sample of cells from the surface of your cervix. This will be done using a small cotton swab, plastic spatula, or brush.  In some cases, fluids (secretions) from the cervix or vagina may also be collected. This information is not intended to replace advice given to you by your health care provider. Make sure you discuss any questions you have with your health care provider. Document Revised: 05/05/2017 Document Reviewed:  05/05/2017 Elsevier Patient Education  [place].

## 2020-02-22 LAB — CERVICOVAGINAL ANCILLARY ONLY
Bacterial Vaginitis (gardnerella): NEGATIVE
Candida Glabrata: NEGATIVE
Candida Vaginitis: NEGATIVE
Chlamydia: NEGATIVE
Comment: NEGATIVE
Comment: NEGATIVE
Comment: NEGATIVE
Comment: NEGATIVE
Comment: NEGATIVE
Comment: NORMAL
Neisseria Gonorrhea: NEGATIVE
Trichomonas: NEGATIVE

## 2020-02-22 LAB — CYTOLOGY - PAP
Comment: NEGATIVE
Diagnosis: NEGATIVE
High risk HPV: NEGATIVE

## 2020-02-22 NOTE — Progress Notes (Signed)
Please call patient with update.   PAP negative for intraepithelial lesion or malignancy. Next PAP recommended in 3 years or sooner if needed.  Negative for STI's.

## 2020-03-22 ENCOUNTER — Ambulatory Visit: Payer: Self-pay | Attending: Family Medicine | Admitting: Licensed Clinical Social Worker

## 2020-03-22 DIAGNOSIS — Z598 Other problems related to housing and economic circumstances: Secondary | ICD-10-CM

## 2020-03-22 DIAGNOSIS — Z599 Problem related to housing and economic circumstances, unspecified: Secondary | ICD-10-CM

## 2020-03-23 ENCOUNTER — Other Ambulatory Visit: Payer: Self-pay

## 2020-03-31 NOTE — BH Specialist Note (Signed)
Integrated Behavioral Health Visit via Telemedicine (Telephone)  03/22/2020 Monique Bowman [id number]   Session Start time: 2:30 PM  Session End time: 2:40 PM Total time: 10  Referring Provider: Dr. Chapman Fitch Type of Visit: Telephonic Patient location: Home [facility] Provider location: Office All persons participating in visit: LCSW and Patient  Confirmed patient's address: Yes  Confirmed patient's phone number: Yes  Any changes to demographics: No   Confirmed patient's insurance: Yes  Any changes to patient's insurance: Yes   Discussed confidentiality: No    The following statements were read to the patient and/or legal guardian that are established with the [facility] Provider.  "The purpose of this phone visit is to provide behavioral health care while limiting exposure to the coronavirus (COVID19).  There is a possibility of technology failure and discussed alternative modes of communication if that failure occurs."  "By engaging in this telephone visit, you consent to the provision of healthcare.  Additionally, you authorize for your insurance to be billed for the services provided during this telephone visit."   Patient and/or legal guardian consented to telephone visit: Yes   PRESENTING CONCERNS: Patient and/or family reports the following symptoms/concerns: Pt reports no concerns of depression, anxiety, or stress Duration of problem: NA; Severity of problem: NA  STRENGTHS (Protective Factors/Coping Skills): Pt has strong support system  GOALS ADDRESSED: Patient will: 1.  Reduce symptoms of: stress  2.  Increase knowledge and/or ability of: healthy habits  3.  Demonstrate ability to: Increase adequate support systems for patient/family  INTERVENTIONS: Interventions utilized:  Supportive Counseling Standardized Assessments completed: Not Needed  ASSESSMENT: Patient was referred to LCSW to address financial strain and anxiety regarding health.   Pt shared that  she is doing well and has no identified stressors. Pt denies anxiety or depression symptoms. LCSW informed of various supportive resources in clinic.     PLAN: 1. Follow up with behavioral health clinician on : Contact LCSW with any additional behavioral health and/or resource needs 2. Behavioral recommendations: Contact PCP with any health concerns 3. Referral(s): [place] (In Clinic)  Rebekah Chesterfield, [place] 03/31/2020 11:45 AM

## 2020-05-18 ENCOUNTER — Other Ambulatory Visit (HOSPITAL_COMMUNITY): Payer: Self-pay | Admitting: *Deleted

## 2020-05-19 ENCOUNTER — Encounter (HOSPITAL_COMMUNITY)
Admission: RE | Admit: 2020-05-19 | Discharge: 2020-05-19 | Disposition: A | Discharge status: Home / Self Care | Payer: 59 | Source: Ambulatory Visit | Attending: Obstetrics and Gynecology | Admitting: Obstetrics and Gynecology

## 2020-05-19 DIAGNOSIS — D509 Iron deficiency anemia, unspecified: Secondary | ICD-10-CM | POA: Diagnosis present

## 2020-05-19 DIAGNOSIS — D259 Leiomyoma of uterus, unspecified: Secondary | ICD-10-CM | POA: Diagnosis not present

## 2020-05-19 MED ORDER — SODIUM CHLORIDE 0.9 % IV SOLN
510.0000 mg | INTRAVENOUS | Status: DC
  Administered 2020-05-19: 510 mg via INTRAVENOUS
  Filled 2020-05-19: qty 17

## 2020-05-19 NOTE — Discharge Instructions (Signed)

## 2020-05-22 ENCOUNTER — Other Ambulatory Visit: Payer: Self-pay

## 2020-05-22 ENCOUNTER — Encounter (HOSPITAL_COMMUNITY)
Admission: RE | Admit: 2020-05-22 | Discharge: 2020-05-22 | Disposition: A | Discharge status: Home / Self Care | Payer: 59 | Source: Ambulatory Visit | Attending: Obstetrics and Gynecology | Admitting: Obstetrics and Gynecology

## 2020-05-22 DIAGNOSIS — D509 Iron deficiency anemia, unspecified: Secondary | ICD-10-CM | POA: Diagnosis not present

## 2020-05-22 MED ORDER — SODIUM CHLORIDE 0.9 % IV SOLN
510.0000 mg | INTRAVENOUS | Status: DC
  Administered 2020-05-22: 510 mg via INTRAVENOUS
  Filled 2020-05-22: qty 17

## 2020-08-09 ENCOUNTER — Encounter (HOSPITAL_COMMUNITY): Payer: Self-pay | Admitting: Emergency Medicine

## 2020-08-09 ENCOUNTER — Other Ambulatory Visit: Payer: Self-pay

## 2020-08-09 ENCOUNTER — Emergency Department (HOSPITAL_COMMUNITY)
Admission: EM | Admit: 2020-08-09 | Discharge: 2020-08-09 | Disposition: A | Discharge status: Home / Self Care | Payer: 59 | Attending: Emergency Medicine | Admitting: Emergency Medicine

## 2020-08-09 DIAGNOSIS — D500 Iron deficiency anemia secondary to blood loss (chronic): Secondary | ICD-10-CM

## 2020-08-09 DIAGNOSIS — R799 Abnormal finding of blood chemistry, unspecified: Secondary | ICD-10-CM | POA: Insufficient documentation

## 2020-08-09 DIAGNOSIS — N938 Other specified abnormal uterine and vaginal bleeding: Secondary | ICD-10-CM

## 2020-08-09 LAB — CBC
HCT: 22.9 % — ABNORMAL LOW (ref 36.0–46.0)
Hemoglobin: 6.3 g/dL — CL (ref 12.0–15.0)
MCH: 21.6 pg — ABNORMAL LOW (ref 26.0–34.0)
MCHC: 27.5 g/dL — ABNORMAL LOW (ref 30.0–36.0)
MCV: 78.7 fL — ABNORMAL LOW (ref 80.0–100.0)
Platelets: 621 10*3/uL — ABNORMAL HIGH (ref 150–400)
RBC: 2.91 MIL/uL — ABNORMAL LOW (ref 3.87–5.11)
RDW: 19.8 % — ABNORMAL HIGH (ref 11.5–15.5)
WBC: 7.3 10*3/uL (ref 4.0–10.5)
nRBC: 0 % (ref 0.0–0.2)

## 2020-08-09 LAB — BASIC METABOLIC PANEL
Anion gap: 11 (ref 5–15)
BUN: 12 mg/dL (ref 6–20)
CO2: 24 mmol/L (ref 22–32)
Calcium: 9.3 mg/dL (ref 8.9–10.3)
Chloride: 103 mmol/L (ref 98–111)
Creatinine, Ser: 0.58 mg/dL (ref 0.44–1.00)
GFR, Estimated: >60 mL/min (ref >60)
Glucose, Bld: 90 mg/dL (ref 70–99)
Potassium: 3.7 mmol/L (ref 3.5–5.1)
Sodium: 138 mmol/L (ref 135–145)

## 2020-08-09 LAB — I-STAT BETA HCG BLOOD, ED (MC, WL, AP ONLY): I-stat hCG, quantitative: <5 m[IU]/mL (ref <5)

## 2020-08-09 LAB — PREPARE RBC (CROSSMATCH)

## 2020-08-09 MED ORDER — SODIUM CHLORIDE 0.9 % IV SOLN
10.0000 mL/h | Freq: Once | INTRAVENOUS | Status: AC
  Administered 2020-08-09: 10 mL/h via INTRAVENOUS

## 2020-08-09 MED ORDER — FERROUS SULFATE 325 (65 FE) MG PO TABS: 325.0000 mg | ORAL_TABLET | Freq: Every day | ORAL | 0 refills | Status: DC

## 2020-08-09 NOTE — Discharge Instructions (Signed)
A new prescription for iron was sent to your pharmacy.  He received 2 units of blood here.  Follow-up with your doctor for ongoing treatment for your heavy periods.

## 2020-08-09 NOTE — ED Provider Notes (Signed)
[facility] DEPT Provider Note   CSN: [id number] Arrival date & time: 08/09/20  1208     History Chief Complaint  Patient presents with  . Abnormal Lab    Monique Bowman is a 34 y.o. female.  34 year old female presents with low hemoglobin for her doctor's office.  Had a fingerstick hemoglobin was 5.8.  Has a known history of fibroids but denies any severe vaginal bleeding at this point.  Denies any fever or chills.  Denies any weakness.  States she feels at her baseline otherwise.  Doctor called her and told her to come to [place] long for blood transfusion        Past Medical History:  Diagnosis Date  . Known health problems: none     Patient Active Problem List   Diagnosis Date Noted  . Iron deficiency anemia due to chronic blood loss 01/18/2020  . Uterine fibroid 01/18/2020  . Family history of ovarian cancer   . Palpable mass of pelvis   . Symptomatic anemia 01/15/2020  . Known health problems: none 01/14/2020    Past Surgical History:  Procedure Laterality Date  . NO PAST SURGERIES       OB History   No obstetric history on file.     Family History  Problem Relation Age of Onset  . Diabetes Mother   . Breast cancer Mother        remission  . Ovarian cancer Mother        remission  . Congenital heart disease Brother        Died at age 35    Social History   Tobacco Use  . Smoking status: Never Smoker  . Smokeless tobacco: Never Used  Vaping Use  . Vaping Use: Never used  Substance Use Topics  . Alcohol use: No  . Drug use: No    Home Medications Prior to Admission medications   Medication Sig Start Date End Date Taking? Authorizing Provider  ferrous sulfate 325 (65 FE) MG tablet Take 1 tablet (325 mg total) by mouth daily. 01/16/20 04/15/20  Gladys Damme, MD    Allergies    Patient has no known allergies.  Review of Systems   Review of Systems  All other systems reviewed and are  negative.   Physical Exam Updated Vital Signs BP 123/79 (BP Location: Left Arm)   Pulse 91   Temp 98.4 F (36.9 C) (Oral)   Resp 16   SpO2 95%   Physical Exam Vitals and nursing note reviewed.  Constitutional:      General: She is not in acute distress.    Appearance: Normal appearance. She is well-developed. She is not toxic-appearing.  HENT:     Head: Normocephalic and atraumatic.  Eyes:     General: Lids are normal.     Conjunctiva/sclera: Conjunctivae normal.     Pupils: Pupils are equal, round, and reactive to light.  Neck:     Thyroid: No thyroid mass.     Trachea: No tracheal deviation.  Cardiovascular:     Rate and Rhythm: Normal rate and regular rhythm.     Heart sounds: Normal heart sounds. No murmur heard.  No gallop.   Pulmonary:     Effort: Pulmonary effort is normal. No respiratory distress.     Breath sounds: Normal breath sounds. No stridor. No decreased breath sounds, wheezing, rhonchi or rales.  Abdominal:     General: Bowel sounds are normal. There is no distension.  Palpations: Abdomen is soft.     Tenderness: There is no abdominal tenderness. There is no rebound.  Musculoskeletal:        General: No tenderness. Normal range of motion.     Cervical back: Normal range of motion and neck supple.  Skin:    General: Skin is warm and dry.     Findings: No abrasion or rash.  Neurological:     Mental Status: She is alert and oriented to person, place, and time.     GCS: GCS eye subscore is 4. GCS verbal subscore is 5. GCS motor subscore is 6.     Cranial Nerves: No cranial nerve deficit.     Sensory: No sensory deficit.  Psychiatric:        Speech: Speech normal.        Behavior: Behavior normal.     ED Results / Procedures / Treatments   Labs (all labs ordered are listed, but only abnormal results are displayed) Labs Reviewed  BASIC METABOLIC PANEL  CBC  I-STAT BETA HCG BLOOD, ED ([facility], [facility], [facility] ONLY)  TYPE AND SCREEN     EKG None  Radiology No results found.  Procedures Procedures (including critical care time)  Medications Ordered in ED Medications - No data to display  ED Course  I have reviewed the triage vital signs and the nursing notes.  Pertinent labs & imaging results that were available during my care of the patient were reviewed by me and considered in my medical decision making (see chart for details).    MDM Rules/Calculators/A&P                          Pt to be tranfused with 2 units of prbcs and then d/c Final Clinical Impression(s) / ED Diagnoses Final diagnoses:  None    Rx / DC Orders ED Discharge Orders    None       Lacretia Leigh, MD 08/14/20 1345

## 2020-08-09 NOTE — ED Triage Notes (Signed)
Patient sent by OBGYN after labs resulted showing hemoglobin 5.8. Hx fibroids. Denies complaints.

## 2020-08-10 LAB — BPAM RBC
Blood Product Expiration Date: 202201022359
Blood Product Expiration Date: 202201022359
ISSUE DATE / TIME: 202112011505
ISSUE DATE / TIME: 202112011837
Unit Type and Rh: 5100
Unit Type and Rh: 5100

## 2020-08-10 LAB — TYPE AND SCREEN
ABO/RH(D): O POS
Antibody Screen: NEGATIVE
Unit division: 0
Unit division: 0

## 2020-08-15 NOTE — H&P (Signed)
34 year old G 0 with multiple fibroids and anemia. Tried Depo Lupron x 3 and hemoglobin still 5. Went for blood transfusion on December 1 at [place]. She desires future fertility   Ultrasound multiple fibroids - largest approximately 11 cm  Past Medical History:  Diagnosis Date  . Known health problems: none    Past Surgical History:  Procedure Laterality Date  . NO PAST SURGERIES     Prior to Admission medications   Medication Sig Start Date End Date Taking? Authorizing Provider  acetaminophen (TYLENOL) 500 MG tablet Take 1,000 mg by mouth every 6 (six) hours as needed for mild pain or moderate pain.   Yes [provider]  ferrous sulfate 325 (65 FE) MG tablet Take 1 tablet (325 mg total) by mouth daily. 08/09/20 11/07/20 Yes Blanchie Dessert, MD   Allergies NKDA  Family History  Problem Relation Age of Onset  . Diabetes Mother   . Breast cancer Mother        remission  . Ovarian cancer Mother        remission  . Congenital heart disease Brother        Died at age 107   Social History   Socioeconomic History  . Marital status: Single    Spouse name: Not on file  . Number of children: Not on file  . Years of education: Not on file  . Highest education level: Not on file  Occupational History  . Not on file  Tobacco Use  . Smoking status: Never Smoker  . Smokeless tobacco: Never Used  Vaping Use  . Vaping Use: Never used  Substance and Sexual Activity  . Alcohol use: No  . Drug use: No  . Sexual activity: Never    Birth control/protection: None  Other Topics Concern  . Not on file  Social History Narrative  . Not on file   Social Determinants of Health   Financial Resource Strain:   . Difficulty of Paying Living Expenses: Not on file  Food Insecurity:   . Worried About Charity fundraiser in the Last Year: Not on file  . Ran Out of Food in the Last Year: Not on file  Transportation Needs:   . Lack of Transportation (Medical): Not on file  .  Lack of Transportation (Non-Medical): Not on file  Physical Activity:   . Days of Exercise per Week: Not on file  . Minutes of Exercise per Session: Not on file  Stress:   . Feeling of Stress : Not on file  Social Connections:   . Frequency of Communication with Friends and Family: Not on file  . Frequency of Social Gatherings with Friends and Family: Not on file  . Attends Religious Services: Not on file  . Active Member of Clubs or Organizations: Not on file  . Attends Archivist Meetings: Not on file  . Marital Status: Not on file   General alert and oriented Lung CTAB Car RRR  Abdomen 16 week size uterus   IMPRESSION: Multiple fibroids Menorrhagia Anemia  PLAN: Abdominal myomectomies May need additional blood transfusion with procedure depending on intraoperative EBL

## 2020-08-18 ENCOUNTER — Other Ambulatory Visit (HOSPITAL_COMMUNITY): Payer: Medicaid Other

## 2020-08-18 ENCOUNTER — Other Ambulatory Visit: Payer: Self-pay

## 2020-08-18 ENCOUNTER — Encounter (HOSPITAL_COMMUNITY): Payer: Self-pay

## 2020-08-18 ENCOUNTER — Encounter (HOSPITAL_COMMUNITY)
Admit: 2020-08-18 | Discharge: 2020-08-18 | Disposition: A | Discharge status: Home / Self Care | Payer: 59 | Attending: Obstetrics and Gynecology | Admitting: Obstetrics and Gynecology

## 2020-08-18 DIAGNOSIS — Z01812 Encounter for preprocedural laboratory examination: Secondary | ICD-10-CM | POA: Diagnosis not present

## 2020-08-18 DIAGNOSIS — Z20822 Contact with and (suspected) exposure to covid-19: Secondary | ICD-10-CM | POA: Diagnosis not present

## 2020-08-18 HISTORY — DX: Anemia, unspecified: D64.9

## 2020-08-18 HISTORY — DX: Cardiac murmur, unspecified: R01.1

## 2020-08-18 NOTE — Patient Instructions (Signed)
DUE TO COVID-19 ONLY ONE VISITOR IS ALLOWED TO COME WITH YOU AND STAY IN THE WAITING ROOM ONLY DURING PRE OP AND PROCEDURE DAY OF SURGERY.  THE 1 VISITOR  MAY VISIT WITH YOU AFTER SURGERY IN YOUR PRIVATE ROOM DURING VISITING HOURS ONLY! The yellow card is the tracking number for day of surgery so that your mom can see when you move from pre op to surgery.  Your covid test will be done on the day of surgery with a rapid test.               Monique Bowman    Your procedure is scheduled on: 12.14.21   Report to [facility]  Entrance Report  to Short stay  At 5:00 AM. See map in red bag     Call this number if you have problems the morning of surgery [place], NO [place].  No food after midnight.   You can have clear liquids until 4:00 am.  Then nothing more by mouth. Follow any special instructoins from Dr. Helane Rima    Take these medicines the morning of surgery with A SIP OF WATER: None                                 You may not have any metal on your body including hair pins and              piercings  Do not wear jewelry, make-up, lotions, powders or perfumes, deodorant             Do not wear nail polish on your fingernails.  Do not shave  48 hours prior to surgery.     Do not bring valuables to the hospital. [place].  Contacts, dentures or bridgework may not be worn into surgery.       Special Instructions: N/A              Please read over the following fact sheets you were given: _____________________________________________________________________             [facility] - Preparing for Surgery Before surgery, you can play an important role.   Because skin is not sterile, your skin needs to be as free of germs as possible.   You can reduce the number of germs on your skin by washing with CHG (chlorahexidine  gluconate) soap before surgery.   CHG is an antiseptic cleaner which kills germs and bonds with the skin to continue killing germs even after washing. Please DO NOT use if you have an allergy to CHG or antibacterial soaps.   If your skin becomes reddened/irritated stop using the CHG and inform your nurse when you arrive at Short Stay. Do not shave (including legs and underarms) for at least 48 hours prior to the first CHG shower.    Please follow these instructions carefully:  1.  Shower with CHG Soap the night before surgery and the  morning of Surgery.  2.  If you choose to wash your hair, wash your hair first as usual with your  normal  shampoo.  3.  After you shampoo, rinse your hair and body thoroughly to remove the  shampoo.  4.  Use CHG as you would any other liquid soap.  You can apply chg directly  to the skin and wash                       Gently with a scrungie or clean washcloth.  5.  Apply the CHG Soap to your body ONLY FROM THE NECK DOWN.   Do not use on face/ open                           Wound or open sores. Avoid contact with eyes, ears mouth and genitals (private parts).                       Wash face,  Genitals (private parts) with your normal soap.             6.  Wash thoroughly, paying special attention to the area where your surgery  will be performed.  7.  Thoroughly rinse your body with warm water from the neck down.  8.  DO NOT shower/wash with your normal soap after using and rinsing off  the CHG Soap.             9.  Pat yourself dry with a clean towel.            10.  Wear clean pajamas.            11.  Place clean sheets on your bed the night of your first shower and do not  sleep with pets. Day of Surgery : Do not apply any lotions/deodorants the morning of surgery.  Please wear clean clothes to the hospital/surgery center.  FAILURE TO FOLLOW THESE INSTRUCTIONS MAY RESULT IN THE CANCELLATION OF YOUR SURGERY PATIENT  SIGNATURE_________________________________  NURSE SIGNATURE__________________________________  ________________________________________________________________________

## 2020-08-18 NOTE — Progress Notes (Signed)
COVID Vaccine Completed:No Date COVID Vaccine completed: COVID vaccine manufacturer: Pfizer    Moderna   [employer]   PCP - Dr. Verne Grain Cardiologist - no  Chest x-ray - no Stress Test - no ECHO - no Cardiac Cath - no Pacemaker/ICD device last checked:NA  Sleep Study - no CPAP -   Fasting Blood Sugar - NA Checks Blood Sugar _____ times a day  Blood Thinner Instructions:NA Aspirin Instructions: Last Dose:  Anesthesia review:   Patient denies shortness of breath, fever, cough and chest pain at PAT appointment yes  Patient verbalized understanding of instructions that were given to them at the PAT appointment. Patient was also instructed that they will need to review over the PAT instructions again at home before surgery. Yes Pt' last H&H was 6.3/22.9. she has a transfusion on 12/1/ 21. She will get a rapid Covid test on DOS.

## 2020-08-21 ENCOUNTER — Other Ambulatory Visit: Payer: Self-pay

## 2020-08-21 ENCOUNTER — Encounter (HOSPITAL_COMMUNITY): Payer: Self-pay | Admitting: Obstetrics and Gynecology

## 2020-08-21 ENCOUNTER — Encounter (HOSPITAL_COMMUNITY)
Admission: RE | Admit: 2020-08-21 | Discharge: 2020-08-21 | Disposition: A | Discharge status: Home / Self Care | Payer: 59 | Source: Ambulatory Visit | Attending: Obstetrics and Gynecology | Admitting: Obstetrics and Gynecology

## 2020-08-21 ENCOUNTER — Other Ambulatory Visit (HOSPITAL_COMMUNITY)
Admission: RE | Admit: 2020-08-21 | Discharge: 2020-08-21 | Disposition: A | Discharge status: Home / Self Care | Payer: 59 | Source: Ambulatory Visit | Attending: Obstetrics and Gynecology | Admitting: Obstetrics and Gynecology

## 2020-08-21 DIAGNOSIS — Z20822 Contact with and (suspected) exposure to covid-19: Secondary | ICD-10-CM | POA: Insufficient documentation

## 2020-08-21 DIAGNOSIS — Z01812 Encounter for preprocedural laboratory examination: Secondary | ICD-10-CM | POA: Insufficient documentation

## 2020-08-21 LAB — CBC
HCT: 25.7 % — ABNORMAL LOW (ref 36.0–46.0)
Hemoglobin: 7.4 g/dL — ABNORMAL LOW (ref 12.0–15.0)
MCH: 23 pg — ABNORMAL LOW (ref 26.0–34.0)
MCHC: 28.8 g/dL — ABNORMAL LOW (ref 30.0–36.0)
MCV: 79.8 fL — ABNORMAL LOW (ref 80.0–100.0)
Platelets: 601 10*3/uL — ABNORMAL HIGH (ref 150–400)
RBC: 3.22 MIL/uL — ABNORMAL LOW (ref 3.87–5.11)
RDW: 18.5 % — ABNORMAL HIGH (ref 11.5–15.5)
WBC: 5.5 10*3/uL (ref 4.0–10.5)
nRBC: 0 % (ref 0.0–0.2)

## 2020-08-21 LAB — PREGNANCY, URINE: Preg Test, Ur: NEGATIVE

## 2020-08-21 LAB — PREPARE RBC (CROSSMATCH)

## 2020-08-21 LAB — SARS CORONAVIRUS 2 (TAT 6-24 HRS): SARS Coronavirus 2: NEGATIVE

## 2020-08-21 NOTE — Patient Instructions (Signed)
DUE TO COVID-19 ONLY ONE VISITOR IS ALLOWED TO COME WITH YOU AND STAY IN THE WAITING ROOM ONLY DURING PRE OP AND PROCEDURE DAY OF SURGERY. THE 1 VISITOR  MAY VISIT WITH YOU AFTER SURGERY IN YOUR PRIVATE ROOM DURING VISITING HOURS ONLY!  YOU NEED TO HAVE A COVID 19 TEST ON__12/13/21_____ @_9 :30______, THIS TEST MUST BE DONE BEFORE SURGERY,  COVID TESTING SITE [place] [id number], IT IS ON THE RIGHT GOING OUT [address] APPROXIMATELY  2 MINUTES PAST [employer] ON THE RIGHT. ONCE YOUR COVID TEST IS COMPLETED,  PLEASE BEGIN THE QUARANTINE INSTRUCTIONS AS OUTLINED IN YOUR HANDOUT.                Monique Bowman    Your procedure is scheduled on: 08/22/20   Report to [facility] Main  Entrance   Report to Short stay at 5:30 AM     Call this number if you have problems the morning of surgery [phone]    Remember: Do not eat food or drink liquids :After Midnight.   BRUSH YOUR TEETH MORNING OF SURGERY AND RINSE YOUR MOUTH OUT, NO CHEWING GUM CANDY OR MINTS.     Take these medicines the morning of surgery with A SIP OF WATER: none  DO NOT TAKE ANY DIABETIC MEDICATIONS DAY OF YOUR SURGERY                               You may not have any metal on your body including hair pins and              piercings  Do not wear jewelry, make-up, lotions, powders or perfumes, deodorant             Do not wear nail polish on your fingernails.  Do not shave  48 hours prior to surgery.              Men may shave face and neck.   Do not bring valuables to the hospital. [place].  Contacts, dentures or bridgework may not be worn into surgery.  Leave suitcase in the car. After surgery it may be brought to your room.               Please read over the following fact sheets you were given: _____________________________________________________________________             [facility] - Preparing for  Surgery Before surgery, you can play an important role.  Because skin is not sterile, your skin needs to be as free of germs as possible.  You can reduce the number of germs on your skin by washing with CHG (chlorahexidine gluconate) soap before surgery.  CHG is an antiseptic cleaner which kills germs and bonds with the skin to continue killing germs even after washing. Please DO NOT use if you have an allergy to CHG or antibacterial soaps.  If your skin becomes reddened/irritated stop using the CHG and inform your nurse when you arrive at Short Stay. Do not shave (including legs and underarms) for at least 48 hours prior to the first CHG shower.  You may shave your face/neck. Please follow these instructions carefully:  1.  Shower with CHG Soap the night before surgery and the  morning of Surgery.  2.  If  you choose to wash your hair, wash your hair first as usual with your  normal  shampoo.  3.  After you shampoo, rinse your hair and body thoroughly to remove the  shampoo.                                        4.  Use CHG as you would any other liquid soap.  You can apply chg directly  to the skin and wash                       Gently with a scrungie or clean washcloth.  5.  Apply the CHG Soap to your body ONLY FROM THE NECK DOWN.   Do not use on face/ open                           Wound or open sores. Avoid contact with eyes, ears mouth and genitals (private parts).                       Wash face,  Genitals (private parts) with your normal soap.             6.  Wash thoroughly, paying special attention to the area where your surgery  will be performed.  7.  Thoroughly rinse your body with warm water from the neck down.  8.  DO NOT shower/wash with your normal soap after using and rinsing off  the CHG Soap.             9.  Pat yourself dry with a clean towel.            10.  Wear clean pajamas.            11.  Place clean sheets on your bed the night of your first shower and do not  sleep with  pets. Day of Surgery : Do not apply any lotions/deodorants the morning of surgery.  Please wear clean clothes to the hospital/surgery center.  FAILURE TO FOLLOW THESE INSTRUCTIONS MAY RESULT IN THE CANCELLATION OF YOUR SURGERY PATIENT SIGNATURE_________________________________  NURSE SIGNATURE__________________________________  ________________________________________________________________________

## 2020-08-21 NOTE — Anesthesia Preprocedure Evaluation (Addendum)
Anesthesia Evaluation  Patient identified by MRN, date of birth, ID band Patient awake    Reviewed: Allergy & Precautions, NPO status , Patient's Chart, lab work & pertinent test results  Airway Mallampati: II  TM Distance: >3 FB Neck ROM: Full    Dental no notable dental hx. (+) Teeth Intact   Pulmonary neg pulmonary ROS,    Pulmonary exam normal breath sounds clear to auscultation       Cardiovascular negative cardio ROS Normal cardiovascular exam+ Valvular Problems/Murmurs  Rhythm:Regular Rate:Normal     Neuro/Psych negative neurological ROS  negative psych ROS   GI/Hepatic negative GI ROS, Neg liver ROS,   Endo/Other  negative endocrine ROS  Renal/GU negative Renal ROS  negative genitourinary   Musculoskeletal   Abdominal   Peds  Hematology  (+) anemia ,   Anesthesia Other Findings   Reproductive/Obstetrics Uterine leiomyomas                            Anesthesia Physical Anesthesia Plan  ASA: II  Anesthesia Plan: General   Post-op Pain Management:    Induction: Intravenous  PONV Risk Score and Plan: 4 or greater and Scopolamine patch - Pre-op, Midazolam, Ondansetron, Dexamethasone and Treatment may vary due to age or medical condition  Airway Management Planned: Oral ETT  Additional Equipment:   Intra-op Plan:   Post-operative Plan: Extubation in OR  Informed Consent: I have reviewed the patients History and Physical, chart, labs and discussed the procedure including the risks, benefits and alternatives for the proposed anesthesia with the patient or authorized representative who has indicated his/her understanding and acceptance.     Dental advisory given  Plan Discussed with: CRNA and Anesthesiologist  Anesthesia Plan Comments:        Anesthesia Quick Evaluation

## 2020-08-22 ENCOUNTER — Encounter (HOSPITAL_COMMUNITY)
Admission: RE | Disposition: A | Discharge status: Home / Self Care | Payer: Self-pay | Source: Home / Self Care | Attending: Obstetrics and Gynecology

## 2020-08-22 ENCOUNTER — Inpatient Hospital Stay (HOSPITAL_COMMUNITY): Payer: 59 | Admitting: Certified Registered Nurse Anesthetist

## 2020-08-22 ENCOUNTER — Encounter (HOSPITAL_COMMUNITY): Payer: Self-pay | Admitting: Obstetrics and Gynecology

## 2020-08-22 ENCOUNTER — Ambulatory Visit (HOSPITAL_COMMUNITY)
Admission: RE | Admit: 2020-08-22 | Discharge: 2020-08-23 | Disposition: A | Discharge status: Home / Self Care | Payer: 59 | Attending: Obstetrics and Gynecology | Admitting: Obstetrics and Gynecology

## 2020-08-22 DIAGNOSIS — Z8049 Family history of malignant neoplasm of other genital organs: Secondary | ICD-10-CM | POA: Diagnosis not present

## 2020-08-22 DIAGNOSIS — Z9889 Other specified postprocedural states: Secondary | ICD-10-CM

## 2020-08-22 DIAGNOSIS — D649 Anemia, unspecified: Secondary | ICD-10-CM | POA: Insufficient documentation

## 2020-08-22 DIAGNOSIS — Z803 Family history of malignant neoplasm of breast: Secondary | ICD-10-CM | POA: Diagnosis not present

## 2020-08-22 DIAGNOSIS — D219 Benign neoplasm of connective and other soft tissue, unspecified: Secondary | ICD-10-CM | POA: Diagnosis present

## 2020-08-22 DIAGNOSIS — N920 Excessive and frequent menstruation with regular cycle: Secondary | ICD-10-CM | POA: Diagnosis not present

## 2020-08-22 DIAGNOSIS — D259 Leiomyoma of uterus, unspecified: Principal | ICD-10-CM | POA: Insufficient documentation

## 2020-08-22 HISTORY — PX: MYOMECTOMY: SHX85

## 2020-08-22 SURGERY — MYOMECTOMY, ABDOMINAL APPROACH
Anesthesia: General

## 2020-08-22 MED ORDER — ONDANSETRON HCL 4 MG/2ML IJ SOLN
INTRAMUSCULAR | Status: DC | PRN
  Administered 2020-08-22: 4 mg via INTRAVENOUS

## 2020-08-22 MED ORDER — SODIUM CHLORIDE 0.9 % IV SOLN: INTRAVENOUS | Status: DC | PRN

## 2020-08-22 MED ORDER — SODIUM CHLORIDE (PF) 0.9 % IJ SOLN
INTRAMUSCULAR | Status: AC
  Filled 2020-08-22: qty 20

## 2020-08-22 MED ORDER — DEXTROSE IN LACTATED RINGERS 5 % IV SOLN: INTRAVENOUS | Status: DC

## 2020-08-22 MED ORDER — LACTATED RINGERS IV SOLN: INTRAVENOUS | Status: DC

## 2020-08-22 MED ORDER — MENTHOL 3 MG MT LOZG
1.0000 | LOZENGE | OROMUCOSAL | Status: DC | PRN
  Filled 2020-08-22: qty 9

## 2020-08-22 MED ORDER — LIDOCAINE HCL (CARDIAC) PF 100 MG/5ML IV SOSY
PREFILLED_SYRINGE | INTRAVENOUS | Status: DC | PRN
  Administered 2020-08-22: 60 mg via INTRAVENOUS

## 2020-08-22 MED ORDER — PROPOFOL 10 MG/ML IV BOLUS
INTRAVENOUS | Status: AC
  Filled 2020-08-22: qty 20

## 2020-08-22 MED ORDER — ONDANSETRON HCL 4 MG/2ML IJ SOLN
INTRAMUSCULAR | Status: AC
  Filled 2020-08-22: qty 2

## 2020-08-22 MED ORDER — LIDOCAINE HCL (PF) 2 % IJ SOLN
INTRAMUSCULAR | Status: AC
  Filled 2020-08-22: qty 5

## 2020-08-22 MED ORDER — HYDROMORPHONE HCL 1 MG/ML IJ SOLN
0.2500 mg | INTRAMUSCULAR | Status: DC | PRN
Start: 2020-08-22 — End: 2020-08-22
  Administered 2020-08-22 (×3): 0.5 mg via INTRAVENOUS

## 2020-08-22 MED ORDER — HYDROMORPHONE HCL 1 MG/ML IJ SOLN
INTRAMUSCULAR | Status: AC
  Filled 2020-08-22: qty 1

## 2020-08-22 MED ORDER — SIMETHICONE 80 MG PO CHEW
80.0000 mg | CHEWABLE_TABLET | Freq: Four times a day (QID) | ORAL | Status: DC | PRN
  Administered 2020-08-22: 21:00:00 80 mg via ORAL
  Filled 2020-08-22 (×2): qty 1

## 2020-08-22 MED ORDER — SUGAMMADEX SODIUM 200 MG/2ML IV SOLN
INTRAVENOUS | Status: DC | PRN
  Administered 2020-08-22: 200 mg via INTRAVENOUS

## 2020-08-22 MED ORDER — ROCURONIUM BROMIDE 100 MG/10ML IV SOLN
INTRAVENOUS | Status: DC | PRN
  Administered 2020-08-22: 50 mg via INTRAVENOUS

## 2020-08-22 MED ORDER — PROPOFOL 10 MG/ML IV BOLUS
INTRAVENOUS | Status: DC | PRN
  Administered 2020-08-22: 170 mg via INTRAVENOUS

## 2020-08-22 MED ORDER — MIDAZOLAM HCL 2 MG/2ML IJ SOLN
INTRAMUSCULAR | Status: AC
  Filled 2020-08-22: qty 2

## 2020-08-22 MED ORDER — IBUPROFEN 400 MG PO TABS
800.0000 mg | ORAL_TABLET | Freq: Three times a day (TID) | ORAL | Status: DC
  Administered 2020-08-22 – 2020-08-23 (×2): 800 mg via ORAL
  Filled 2020-08-22 (×3): qty 2

## 2020-08-22 MED ORDER — FENTANYL CITRATE (PF) 100 MCG/2ML IJ SOLN
INTRAMUSCULAR | Status: AC
  Filled 2020-08-22: qty 2

## 2020-08-22 MED ORDER — ONDANSETRON HCL 4 MG/2ML IJ SOLN
4.0000 mg | Freq: Four times a day (QID) | INTRAMUSCULAR | Status: DC | PRN
  Administered 2020-08-22: 15:00:00 4 mg via INTRAVENOUS
  Filled 2020-08-22: qty 2

## 2020-08-22 MED ORDER — FENTANYL CITRATE (PF) 100 MCG/2ML IJ SOLN
INTRAMUSCULAR | Status: DC | PRN
  Administered 2020-08-22 (×2): 100 ug via INTRAVENOUS

## 2020-08-22 MED ORDER — CHLORHEXIDINE GLUCONATE 0.12 % MT SOLN
15.0000 mL | Freq: Once | OROMUCOSAL | Status: AC
  Administered 2020-08-22: 06:00:00 15 mL via OROMUCOSAL

## 2020-08-22 MED ORDER — SCOPOLAMINE 1 MG/3DAYS TD PT72
1.0000 | MEDICATED_PATCH | TRANSDERMAL | Status: DC
  Administered 2020-08-22: 07:00:00 1.5 mg via TRANSDERMAL
  Filled 2020-08-22: qty 1

## 2020-08-22 MED ORDER — MIDAZOLAM HCL 5 MG/5ML IJ SOLN
INTRAMUSCULAR | Status: DC | PRN
  Administered 2020-08-22: 2 mg via INTRAVENOUS

## 2020-08-22 MED ORDER — OXYCODONE HCL 5 MG PO TABS
5.0000 mg | ORAL_TABLET | ORAL | Status: DC | PRN
  Administered 2020-08-22: 22:00:00 10 mg via ORAL
  Filled 2020-08-22 (×2): qty 2

## 2020-08-22 MED ORDER — DEXAMETHASONE SODIUM PHOSPHATE 10 MG/ML IJ SOLN
INTRAMUSCULAR | Status: AC
  Filled 2020-08-22: qty 1

## 2020-08-22 MED ORDER — BUPIVACAINE HCL 0.25 % IJ SOLN
INTRAMUSCULAR | Status: AC
  Filled 2020-08-22: qty 1

## 2020-08-22 MED ORDER — ONDANSETRON HCL 4 MG PO TABS
4.0000 mg | ORAL_TABLET | Freq: Four times a day (QID) | ORAL | Status: DC | PRN
  Filled 2020-08-22: qty 1

## 2020-08-22 MED ORDER — POVIDONE-IODINE 10 % EX SWAB: 2.0000 "application " | Freq: Once | CUTANEOUS | Status: DC

## 2020-08-22 MED ORDER — METHYLENE BLUE 0.5 % INJ SOLN
INTRAVENOUS | Status: AC
  Filled 2020-08-22: qty 10

## 2020-08-22 MED ORDER — DEXAMETHASONE SODIUM PHOSPHATE 10 MG/ML IJ SOLN
INTRAMUSCULAR | Status: DC | PRN
  Administered 2020-08-22: 10 mg via INTRAVENOUS

## 2020-08-22 MED ORDER — ORAL CARE MOUTH RINSE: 15.0000 mL | Freq: Once | OROMUCOSAL | Status: AC

## 2020-08-22 MED ORDER — VASOPRESSIN 20 UNIT/ML IV SOLN
INTRAVENOUS | Status: AC
  Filled 2020-08-22: qty 1

## 2020-08-22 MED ORDER — SODIUM CHLORIDE 0.9 % IV SOLN
2.0000 g | INTRAVENOUS | Status: AC
  Administered 2020-08-22: 07:00:00 2 g via INTRAVENOUS
  Filled 2020-08-22: qty 2

## 2020-08-22 MED ORDER — BUPIVACAINE LIPOSOME 1.3 % IJ SUSP
20.0000 mL | Freq: Once | INTRAMUSCULAR | Status: AC
  Administered 2020-08-22: 09:00:00 20 mL
  Filled 2020-08-22: qty 20

## 2020-08-22 MED ORDER — 0.9 % SODIUM CHLORIDE (POUR BTL) OPTIME
TOPICAL | Status: DC | PRN
  Administered 2020-08-22: 08:00:00 2000 mL

## 2020-08-22 MED ORDER — ROCURONIUM BROMIDE 10 MG/ML (PF) SYRINGE
PREFILLED_SYRINGE | INTRAVENOUS | Status: AC
  Filled 2020-08-22: qty 10

## 2020-08-22 MED ORDER — SODIUM CHLORIDE (PF) 0.9 % IJ SOLN
INTRAMUSCULAR | Status: AC
  Filled 2020-08-22: qty 10

## 2020-08-22 MED ORDER — ONDANSETRON HCL 4 MG/2ML IJ SOLN
4.0000 mg | Freq: Once | INTRAMUSCULAR | Status: AC | PRN
  Administered 2020-08-22: 11:00:00 4 mg via INTRAVENOUS

## 2020-08-22 MED ORDER — DOCUSATE SODIUM 100 MG PO CAPS
100.0000 mg | ORAL_CAPSULE | Freq: Two times a day (BID) | ORAL | Status: DC
  Administered 2020-08-22 – 2020-08-23 (×2): 100 mg via ORAL
  Filled 2020-08-22 (×3): qty 1

## 2020-08-22 MED ORDER — TRAMADOL HCL 50 MG PO TABS
50.0000 mg | ORAL_TABLET | Freq: Four times a day (QID) | ORAL | Status: DC | PRN
  Administered 2020-08-22: 50 mg via ORAL
  Filled 2020-08-22: qty 1

## 2020-08-22 MED ORDER — HYDROMORPHONE HCL 1 MG/ML IJ SOLN
0.2000 mg | INTRAMUSCULAR | Status: DC | PRN
Start: 2020-08-22 — End: 2020-08-23
  Administered 2020-08-22: 0.6 mg via INTRAVENOUS
  Filled 2020-08-22: qty 1

## 2020-08-22 SURGICAL SUPPLY — 59 items
ADH SKN CLS APL DERMABOND .7 (GAUZE/BANDAGES/DRESSINGS) ×1
BARRIER ADHS 3X4 INTERCEED (GAUZE/BANDAGES/DRESSINGS) IMPLANT
BENZOIN TINCTURE PRP APPL 2/3 (GAUZE/BANDAGES/DRESSINGS) ×3 IMPLANT
BLADE EXTENDED COATED 6.5IN (ELECTRODE) IMPLANT
BLADE HEX COATED 2.75 (ELECTRODE) ×3 IMPLANT
CANISTER SUCT 3000ML PPV (MISCELLANEOUS) ×3 IMPLANT
COVER MAYO STAND STRL (DRAPES) ×3 IMPLANT
COVER WAND RF STERILE (DRAPES) ×3 IMPLANT
DECANTER SPIKE VIAL GLASS SM (MISCELLANEOUS) ×3 IMPLANT
DERMABOND ADVANCED (GAUZE/BANDAGES/DRESSINGS) ×2
DERMABOND ADVANCED .7 DNX12 (GAUZE/BANDAGES/DRESSINGS) ×1 IMPLANT
DRAPE LAPAROTOMY TRNSV 102X78 (DRAPES) ×3 IMPLANT
DRAPE UTILITY XL STRL (DRAPES) ×3 IMPLANT
DRAPE WARM FLUID 44X44 (DRAPES) ×3 IMPLANT
DRSG OPSITE POSTOP 4X10 (GAUZE/BANDAGES/DRESSINGS) ×3 IMPLANT
DRSG OPSITE POSTOP 4X6 (GAUZE/BANDAGES/DRESSINGS) ×3 IMPLANT
DURAPREP 26ML APPLICATOR (WOUND CARE) ×3 IMPLANT
GAUZE SPONGE 4X4 12PLY STRL (GAUZE/BANDAGES/DRESSINGS) IMPLANT
GLOVE BIO SURGEON STRL SZ 6.5 (GLOVE) ×2 IMPLANT
GLOVE BIO SURGEONS STRL SZ 6.5 (GLOVE) ×1
GLOVE BIOGEL PI IND STRL 7.0 (GLOVE) ×3 IMPLANT
GLOVE BIOGEL PI INDICATOR 7.0 (GLOVE) ×6
GOWN STRL REUS W/TWL LRG LVL3 (GOWN DISPOSABLE) ×9 IMPLANT
HOLDER FOLEY CATH W/STRAP (MISCELLANEOUS) ×3 IMPLANT
KIT TURNOVER CYSTO (KITS) ×3 IMPLANT
NEEDLE HYPO 22GX1.5 SAFETY (NEEDLE) ×3 IMPLANT
NS IRRIG 1000ML POUR BTL (IV SOLUTION) ×6 IMPLANT
NS IRRIG 500ML POUR BTL (IV SOLUTION) IMPLANT
PACK ABDOMINAL GYN (CUSTOM PROCEDURE TRAY) IMPLANT
PAD OB MATERNITY 4.3X12.25 (PERSONAL CARE ITEMS) ×3 IMPLANT
SEPRAFILM MEMBRANE 5X6 (MISCELLANEOUS) IMPLANT
SPONGE LAP 18X18 RF (DISPOSABLE) IMPLANT
SPONGE LAP 4X18 RFD (DISPOSABLE) ×3 IMPLANT
STAPLER VISISTAT 35W (STAPLE) IMPLANT
SUT MNCRL 0 MO-4 VIOLET 18 CR (SUTURE) ×2 IMPLANT
SUT MON AB 2-0 CT1 36 (SUTURE) ×6 IMPLANT
SUT MON AB 2-0 SH 27 (SUTURE) ×6
SUT MON AB 2-0 SH27 (SUTURE) ×2 IMPLANT
SUT MONOCRYL 0 MO 4 18  CR/8 (SUTURE) ×6
SUT PDS AB 0 CT 36 (SUTURE) IMPLANT
SUT PDS AB 0 CTX 60 (SUTURE) IMPLANT
SUT PLAIN 2 0 XLH (SUTURE) IMPLANT
SUT VIC AB 0 CT1 18XCR BRD8 (SUTURE) ×2 IMPLANT
SUT VIC AB 0 CT1 27 (SUTURE) ×12
SUT VIC AB 0 CT1 27XBRD ANBCTR (SUTURE) ×4 IMPLANT
SUT VIC AB 0 CT1 36 (SUTURE) ×9 IMPLANT
SUT VIC AB 0 CT1 8-18 (SUTURE) ×6
SUT VIC AB 2-0 CT1 27 (SUTURE) ×3
SUT VIC AB 2-0 CT1 TAPERPNT 27 (SUTURE) ×1 IMPLANT
SUT VIC AB 3-0 PS1 18 (SUTURE)
SUT VIC AB 3-0 PS1 18X BRD (SUTURE) IMPLANT
SUT VIC AB 4-0 KS 27 (SUTURE) ×3 IMPLANT
SUT VICRYL 0 TIES 12 18 (SUTURE) ×3 IMPLANT
SYR BULB IRRIG 60ML STRL (SYRINGE) ×6 IMPLANT
SYR CONTROL 10ML LL (SYRINGE) IMPLANT
TAPE STRIPS DRAPE STRL (GAUZE/BANDAGES/DRESSINGS) ×3 IMPLANT
TOWEL OR 17X26 10 PK STRL BLUE (TOWEL DISPOSABLE) ×6 IMPLANT
TRAY FOLEY W/BAG SLVR 14FR LF (SET/KITS/TRAYS/PACK) ×3 IMPLANT
WATER STERILE IRR 500ML POUR (IV SOLUTION) ×3 IMPLANT

## 2020-08-22 NOTE — Transfer of Care (Signed)
Immediate Anesthesia Transfer of Care Note  Patient: Monique Bowman  Procedure(s) Performed: ABDOMINAL MYOMECTOMY (N/A )  Patient Location: PACU  Anesthesia Type:General  Level of Consciousness: awake, alert  and oriented  Airway & Oxygen Therapy: Patient Spontanous Breathing and Patient connected to face mask oxygen  Post-op Assessment: Report given to RN and Post -op Vital signs reviewed and stable  Post vital signs: Reviewed and stable  Last Vitals:  Vitals Value Taken Time  BP 117/68 08/22/20 0909  Temp    Pulse 87 08/22/20 0911  Resp 18 08/22/20 0911  SpO2 100 % 08/22/20 0911  Vitals shown include unvalidated device data.  Last Pain:  Vitals:   08/22/20 0637  TempSrc: Oral  PainSc:       Patients Stated Pain Goal: 1 (92/92/44 6286)  Complications: No complications documented.

## 2020-08-22 NOTE — Brief Op Note (Signed)
08/22/2020  9:02 AM  PATIENT:  Monique Bowman  34 y.o. female  PRE-OPERATIVE DIAGNOSIS:  Uterine Fibroids Menorrhagia Anemia POST-OPERATIVE DIAGNOSIS: Same  PROCEDURE:  Procedure(s): ABDOMINAL MYOMECTOMY (N/A)  SURGEON:  Surgeon(s) and Role:    * Dian Queen, MD - Primary    * Carlyon Shadow, MD - Assisting  PHYSICIAN ASSISTANT:   ASSISTANTS: none   ANESTHESIA:   general  EBL:  400  BLOOD ADMINISTERED: 2 units of packed RBCs  DRAINS: Urinary Catheter (Foley)   LOCAL MEDICATIONS USED:  OTHER exparel  SPECIMEN:  Source of Specimen:  fibroids  DISPOSITION OF SPECIMEN:  PATHOLOGY  COUNTS:  YES  TOURNIQUET:  * No tourniquets in log *  DICTATION: .Other Dictation: Dictation Number dictated  PLAN OF CARE: Admit to inpatient   PATIENT DISPOSITION:  PACU - hemodynamically stable.   Delay start of Pharmacological VTE agent (>24hrs) due to surgical blood loss or risk of bleeding: not applicable

## 2020-08-22 NOTE — Progress Notes (Signed)
H and P on the chart Will proceed with abdominal myomectomies Results for orders placed or performed during the hospital encounter of 08/21/20 (from the past 24 hour(s))  SARS CORONAVIRUS 2 (TAT 6-24 HRS) Nasopharyngeal Nasopharyngeal Swab     Status: None   Collection Time: 08/21/20  9:51 AM   Specimen: Nasopharyngeal Swab  Result Value Ref Range   SARS Coronavirus 2 NEGATIVE NEGATIVE   Hemoglobin is 7.4 Plan 2 u packed RBCs today as well Discussed with patient

## 2020-08-22 NOTE — Op Note (Signed)
NAME: Monique Bowman, AL[place]O MEDICAL RECORD [id number] ACCOUNT [id number] DATE OF BIRTH:[birth year] FACILITY: [facility] LOCATION: [facility] PHYSICIAN:Laiken Sandy Lynett Fish, MD  OPERATIVE REPORT  DATE OF PROCEDURE:  08/22/2020  PREOPERATIVE DIAGNOSES:  Symptomatic fibroids, menorrhagia, and anemia.  POSTOPERATIVE DIAGNOSES:  Symptomatic fibroids, menorrhagia, and anemia.  PROCEDURE:  Abdominal myomectomy.  SURGEON:  Dian Queen, MD  ASSISTANT:  Dr. Mardelle Matte.  ESTIMATED BLOOD LOSS:  400 mL.  COMPLICATIONS:  None.  PATHOLOGY:  Fibroids.  DRAINS:  Foley catheter.  DESCRIPTION OF PROCEDURE:  The patient was taken to the operating room.  She was intubated.  She was prepped and draped.  At the same time, anesthesia and I had a discussion.  We decided to administer 2 units of red blood cell transfusion because of a  hemoglobin of 7.4.  Foley catheter was inserted.  Exam revealed her uterus was approximately 20-week size.  Pfannenstiel incision was made in standard fashion and carried down with the Bovie to the fascia.  The fascia was scored in the midline and  extended laterally.  The peritoneum was entered bluntly.  The peritoneal incision was then stretched.  We then did an exploration, revealed the uterus was about 20-week size.  There was a really large fibroid that was attached to the fundus that appeared  to be the largest fibroid.  Using a series of towel clamps and maneuvers, we were able to exteriorize the uterus.  We then decided to proceed in the following fashion.  There was about a 5 cm fibroid anteriorly.  I made vertical incision and was able to  dissect the fibroid from the myometrium.  We then closed that with a series of 0 Vicryl pop-offs and 2-0 Vicryl running stitch.  At this point, I decided to then proceed with removal of the very largest fibroid, which was at least 16-18 cm in diameter.   We made a large incision across the fundus of the uterus, and using a series of Bovie,  sharp and blunt dissection, we were able to remove the fibroid.  There were a couple of smaller fibroids that were attached to the serosa that I went ahead and  removed with that at that time as well.  That was the primary source where the bleeding came from.  We then closed that bed with multiple layers using 0 Monocryl and 0 Vicryl running stitch and then 2-0 Vicryl for the serosa.  Once that was hemostatic,  we were then able to see that uterus at that point was actually really nicely decompressed.  The adnexa were normal, and the fallopian tubes were normal as well.  Then, there were 3 other fibroids anteriorly that I had removed through individual  incisions after each fibroid was dissected.  We then closed those areas with 0 Vicryl and 0 Monocryl stitch.  Hemostasis was very good.  This patient has to have a C-section in the future if she were to have a baby because of the nature of the myomectomy.  We then returned the uterus to the abdomen.  We then irrigated.  Hemostasis was very good.  We then closed the peritoneum using 0 Vicryl and then closed the fascia using 0 Vicryl.  Exparel was injected in standard fashion.  The skin was closed with 4-0  Monocryl on a Keith needle.  Benzoin, Steri-Strips, and a honeycomb dressing were applied.  All sponge, lap, and instrument counts were correct x2.  The patient went to recovery room in stable condition.  IN/NUANCE  D:08/22/2020 T:08/22/2020  JOB:[id number]

## 2020-08-22 NOTE — Anesthesia Procedure Notes (Signed)
Procedure Name: Intubation Date/Time: 08/22/2020 7:23 AM Performed by: [place] Kasara Schomer C, CRNA Pre-anesthesia Checklist: Patient identified, Emergency Drugs available, Suction available and Patient being monitored Patient Re-evaluated:Patient Re-evaluated prior to induction Oxygen Delivery Method: Circle system utilized Preoxygenation: Pre-oxygenation with 100% oxygen Induction Type: IV induction Ventilation: Mask ventilation without difficulty Laryngoscope Size: Mac and 3 Grade View: Grade I Tube type: Oral Tube size: 7.0 mm Number of attempts: 1 Airway Equipment and Method: Stylet and Oral airway Placement Confirmation: ETT inserted through vocal cords under direct vision,  positive ETCO2 and breath sounds checked- equal and bilateral Secured at: 20 cm Tube secured with: Tape Dental Injury: Teeth and Oropharynx as per pre-operative assessment

## 2020-08-22 NOTE — Anesthesia Postprocedure Evaluation (Signed)
Anesthesia Post Note  Patient: Monique Bowman  Procedure(s) Performed: ABDOMINAL MYOMECTOMY (N/A )     Patient location during evaluation: PACU Anesthesia Type: General Level of consciousness: awake and alert and oriented Pain management: pain level controlled Vital Signs Assessment: post-procedure vital signs reviewed and stable Respiratory status: spontaneous breathing, nonlabored ventilation and respiratory function stable Cardiovascular status: blood pressure returned to baseline and stable Postop Assessment: no apparent nausea or vomiting Anesthetic complications: no   No complications documented.  Last Vitals:  Vitals:   08/22/20 0939 08/22/20 0954  BP: 115/61 (!) 119/56  Pulse: 88 90  Resp: (!) 7 19  Temp:  37.1 C  SpO2: 99% 95%    Last Pain:  Vitals:   08/22/20 0954  TempSrc:   PainSc: Asleep                 Jamita Mckelvin A.

## 2020-08-22 NOTE — Addendum Note (Signed)
Addendum  created 08/22/20 1438 by [place] Derald Lorge C, CRNA   Charge Capture section accepted

## 2020-08-23 ENCOUNTER — Encounter (HOSPITAL_COMMUNITY): Payer: Self-pay | Admitting: Obstetrics and Gynecology

## 2020-08-23 DIAGNOSIS — D259 Leiomyoma of uterus, unspecified: Secondary | ICD-10-CM | POA: Diagnosis not present

## 2020-08-23 LAB — TYPE AND SCREEN
ABO/RH(D): O POS
Antibody Screen: NEGATIVE
Unit division: 0
Unit division: 0

## 2020-08-23 LAB — BPAM RBC
Blood Product Expiration Date: 202201122359
Blood Product Expiration Date: 202201122359
ISSUE DATE / TIME: 202112140721
ISSUE DATE / TIME: 202112140721
Unit Type and Rh: 5100
Unit Type and Rh: 5100

## 2020-08-23 LAB — CBC
HCT: 21.3 % — ABNORMAL LOW (ref 36.0–46.0)
Hemoglobin: 6.4 g/dL — CL (ref 12.0–15.0)
MCH: 24.9 pg — ABNORMAL LOW (ref 26.0–34.0)
MCHC: 30 g/dL (ref 30.0–36.0)
MCV: 82.9 fL (ref 80.0–100.0)
Platelets: 430 10*3/uL — ABNORMAL HIGH (ref 150–400)
RBC: 2.57 MIL/uL — ABNORMAL LOW (ref 3.87–5.11)
RDW: 18 % — ABNORMAL HIGH (ref 11.5–15.5)
WBC: 11.6 10*3/uL — ABNORMAL HIGH (ref 4.0–10.5)
nRBC: 0 % (ref 0.0–0.2)

## 2020-08-23 LAB — SURGICAL PATHOLOGY

## 2020-08-23 MED ORDER — IBUPROFEN 800 MG PO TABS: 800.0000 mg | ORAL_TABLET | Freq: Three times a day (TID) | ORAL | 0 refills | Status: DC

## 2020-08-23 MED ORDER — OXYCODONE HCL 5 MG PO TABS: 5.0000 mg | ORAL_TABLET | ORAL | 0 refills | Status: DC | PRN

## 2020-08-23 NOTE — Discharge Summary (Signed)
Admission Diagnosis: Symptomatic Fibroids Menorrhagia Anemia  Discharge Diagnosis: Same  Hospital Course: 34 year old female admitted for abdominal myomectomies. She had received 2 units of packed RBCs 2 weeks ago when hemoglobin was 5 in the office.  Yesterday, starting hemoglobin was 7.4. The decision was made to administer 2 units of packed RBCs in the OR.  Multiple fibroids removed.  This morning, patient is eating regular food, ambulating and tolerating pain very well.  Hemoglobin is 6.4 but she is not symptomatic.  BP 92/60 (BP Location: Right Arm)   Pulse 85   Temp 97.9 F (36.6 C) (Oral)   Resp 18   Ht 5\' 4"  (1.626 m)   Wt 76.7 kg   LMP 08/10/2020 Comment: urine preg.negative 08/21/20  SpO2 100%   BMI 29.01 kg/m  Results for orders placed or performed during the hospital encounter of 08/22/20 (from the past 24 hour(s))  CBC     Status: Abnormal   Collection Time: 08/23/20  5:42 AM  Result Value Ref Range   WBC 11.6 (H) 4.0 - 10.5 K/uL   RBC 2.57 (L) 3.87 - 5.11 MIL/uL   Hemoglobin 6.4 (LL) 12.0 - 15.0 g/dL   HCT 21.3 (L) 36.0 - 46.0 %   MCV 82.9 80.0 - 100.0 fL   MCH 24.9 (L) 26.0 - 34.0 pg   MCHC 30.0 30.0 - 36.0 g/dL   RDW 18.0 (H) 11.5 - 15.5 %   Platelets 430 (H) 150 - 400 K/uL   nRBC 0.0 0.0 - 0.2 %   Abdomen is soft and non tender Incision is clean and dry and intact   Patient will be discharged home  Follow up 12/29 for hemoglobin  Discharge instructions and medications discussed  Postop pain medications reviewed

## 2020-08-23 NOTE — Progress Notes (Signed)
Patient has critical Hgb value of 6.4 per lab. MD on call notified, attending will see patient this AM and will reassess. Dawson Bills, RN

## 2020-08-23 NOTE — Progress Notes (Signed)
Discharge instructions given to patient and all questions were answered.  

## 2020-08-31 ENCOUNTER — Inpatient Hospital Stay (HOSPITAL_COMMUNITY): Payer: 59 | Admitting: Certified Registered"

## 2020-08-31 ENCOUNTER — Encounter (HOSPITAL_COMMUNITY)
Admission: RE | Disposition: A | Discharge status: Home / Self Care | Payer: Self-pay | Source: Ambulatory Visit | Attending: Obstetrics and Gynecology

## 2020-08-31 ENCOUNTER — Ambulatory Visit (HOSPITAL_COMMUNITY)
Admission: RE | Admit: 2020-08-31 | Discharge: 2020-08-31 | Disposition: A | Discharge status: Home / Self Care | Payer: 59 | Source: Ambulatory Visit | Attending: Obstetrics and Gynecology | Admitting: Obstetrics and Gynecology

## 2020-08-31 ENCOUNTER — Encounter (HOSPITAL_COMMUNITY): Payer: Self-pay | Admitting: Obstetrics and Gynecology

## 2020-08-31 DIAGNOSIS — Z8041 Family history of malignant neoplasm of ovary: Secondary | ICD-10-CM | POA: Insufficient documentation

## 2020-08-31 DIAGNOSIS — U071 COVID-19: Secondary | ICD-10-CM | POA: Insufficient documentation

## 2020-08-31 DIAGNOSIS — D259 Leiomyoma of uterus, unspecified: Secondary | ICD-10-CM | POA: Insufficient documentation

## 2020-08-31 DIAGNOSIS — Z803 Family history of malignant neoplasm of breast: Secondary | ICD-10-CM | POA: Diagnosis not present

## 2020-08-31 DIAGNOSIS — Z79899 Other long term (current) drug therapy: Secondary | ICD-10-CM | POA: Diagnosis not present

## 2020-08-31 DIAGNOSIS — N920 Excessive and frequent menstruation with regular cycle: Secondary | ICD-10-CM | POA: Diagnosis not present

## 2020-08-31 DIAGNOSIS — Z8249 Family history of ischemic heart disease and other diseases of the circulatory system: Secondary | ICD-10-CM | POA: Insufficient documentation

## 2020-08-31 DIAGNOSIS — Z833 Family history of diabetes mellitus: Secondary | ICD-10-CM | POA: Insufficient documentation

## 2020-08-31 DIAGNOSIS — D649 Anemia, unspecified: Secondary | ICD-10-CM | POA: Insufficient documentation

## 2020-08-31 DIAGNOSIS — Z20822 Contact with and (suspected) exposure to covid-19: Secondary | ICD-10-CM | POA: Diagnosis not present

## 2020-08-31 HISTORY — PX: MYOMECTOMY: SHX85

## 2020-08-31 LAB — SARS CORONAVIRUS 2 BY RT PCR (HOSPITAL ORDER, PERFORMED IN ~~LOC~~ HOSPITAL LAB): SARS Coronavirus 2: POSITIVE — AB

## 2020-08-31 LAB — PREPARE RBC (CROSSMATCH)

## 2020-08-31 SURGERY — MYOMECTOMY, UTERUS, VAGINAL APPROACH
Anesthesia: General

## 2020-08-31 SURGERY — MYOMECTOMY, UTERUS, VAGINAL APPROACH
Anesthesia: Choice

## 2020-08-31 MED ORDER — PROPOFOL 10 MG/ML IV BOLUS
INTRAVENOUS | Status: DC | PRN
  Administered 2020-08-31: 200 mg via INTRAVENOUS

## 2020-08-31 MED ORDER — ALBUMIN HUMAN 5 % IV SOLN: INTRAVENOUS | Status: DC | PRN

## 2020-08-31 MED ORDER — BUPIVACAINE HCL (PF) 0.25 % IJ SOLN
INTRAMUSCULAR | Status: DC | PRN
  Administered 2020-08-31: 4 mL

## 2020-08-31 MED ORDER — ROCURONIUM BROMIDE 10 MG/ML (PF) SYRINGE
PREFILLED_SYRINGE | INTRAVENOUS | Status: DC | PRN
  Administered 2020-08-31: 30 mg via INTRAVENOUS

## 2020-08-31 MED ORDER — DEXAMETHASONE SODIUM PHOSPHATE 10 MG/ML IJ SOLN
INTRAMUSCULAR | Status: DC | PRN
  Administered 2020-08-31: 10 mg via INTRAVENOUS

## 2020-08-31 MED ORDER — LACTATED RINGERS IV SOLN: INTRAVENOUS | Status: DC | PRN

## 2020-08-31 MED ORDER — SODIUM CHLORIDE 0.9% IV SOLUTION: Freq: Once | INTRAVENOUS | Status: DC

## 2020-08-31 MED ORDER — 0.9 % SODIUM CHLORIDE (POUR BTL) OPTIME
TOPICAL | Status: DC | PRN
  Administered 2020-08-31: 17:00:00 2000 mL

## 2020-08-31 MED ORDER — SUGAMMADEX SODIUM 200 MG/2ML IV SOLN
INTRAVENOUS | Status: DC | PRN
  Administered 2020-08-31: 200 mg via INTRAVENOUS

## 2020-08-31 MED ORDER — ONDANSETRON HCL 4 MG/2ML IJ SOLN
INTRAMUSCULAR | Status: DC | PRN
  Administered 2020-08-31: 4 mg via INTRAVENOUS

## 2020-08-31 MED ORDER — BUPIVACAINE HCL (PF) 0.25 % IJ SOLN
INTRAMUSCULAR | Status: AC
  Filled 2020-08-31: qty 30

## 2020-08-31 MED ORDER — SUCCINYLCHOLINE CHLORIDE 200 MG/10ML IV SOSY
PREFILLED_SYRINGE | INTRAVENOUS | Status: DC | PRN
  Administered 2020-08-31: 100 mg via INTRAVENOUS

## 2020-08-31 MED ORDER — LIDOCAINE 2% (20 MG/ML) 5 ML SYRINGE
INTRAMUSCULAR | Status: DC | PRN
  Administered 2020-08-31: 60 mg via INTRAVENOUS

## 2020-08-31 MED ORDER — BUPIVACAINE LIPOSOME 1.3 % IJ SUSP
20.0000 mL | Freq: Once | INTRAMUSCULAR | Status: DC
  Filled 2020-08-31: qty 20

## 2020-08-31 MED ORDER — LACTATED RINGERS IV SOLN
INTRAVENOUS | Status: DC | PRN
Start: 2020-08-31 — End: 2020-08-31

## 2020-08-31 MED ORDER — CEFAZOLIN SODIUM-DEXTROSE 2-3 GM-%(50ML) IV SOLR
INTRAVENOUS | Status: DC | PRN
  Administered 2020-08-31: 2 g via INTRAVENOUS

## 2020-08-31 MED ORDER — FENTANYL CITRATE (PF) 250 MCG/5ML IJ SOLN
INTRAMUSCULAR | Status: DC | PRN
  Administered 2020-08-31: 100 ug via INTRAVENOUS
  Administered 2020-08-31: 25 ug via INTRAVENOUS

## 2020-08-31 MED ORDER — PHENYLEPHRINE HCL-NACL 10-0.9 MG/250ML-% IV SOLN
INTRAVENOUS | Status: DC | PRN
  Administered 2020-08-31: 20 ug/min via INTRAVENOUS

## 2020-08-31 SURGICAL SUPPLY — 54 items
BARRIER ADHS 3X4 INTERCEED (GAUZE/BANDAGES/DRESSINGS) IMPLANT
BENZOIN TINCTURE PRP APPL 2/3 (GAUZE/BANDAGES/DRESSINGS) IMPLANT
BLADE SURG 10 STRL SS (BLADE) ×6 IMPLANT
BRR ADH 4X3 ABS CNTRL BYND (GAUZE/BANDAGES/DRESSINGS)
CANISTER SUCT 3000ML PPV (MISCELLANEOUS) ×3 IMPLANT
CATH ROBINSON RED A/P 16FR (CATHETERS) ×3 IMPLANT
CLOSURE WOUND 1/2 X4 (GAUZE/BANDAGES/DRESSINGS)
CLOSURE WOUND 1/4 X3 (GAUZE/BANDAGES/DRESSINGS)
DECANTER SPIKE VIAL GLASS SM (MISCELLANEOUS) IMPLANT
DRAPE UNDERBUTTOCKS STRL (DISPOSABLE) ×3 IMPLANT
DRAPE UTILITY XL STRL (DRAPES) ×3 IMPLANT
DRAPE WARM FLUID 44X44 (DRAPES) ×3 IMPLANT
DRSG OPSITE POSTOP 4X10 (GAUZE/BANDAGES/DRESSINGS) IMPLANT
DURAPREP 26ML APPLICATOR (WOUND CARE) ×3 IMPLANT
GAUZE 4X4 16PLY RFD (DISPOSABLE) ×6 IMPLANT
GLOVE BIO SURGEON STRL SZ8 (GLOVE) ×6 IMPLANT
GLOVE BIOGEL PI IND STRL 7.0 (GLOVE) ×1 IMPLANT
GLOVE BIOGEL PI IND STRL 8 (GLOVE) ×1 IMPLANT
GLOVE BIOGEL PI INDICATOR 7.0 (GLOVE) ×2
GLOVE BIOGEL PI INDICATOR 8 (GLOVE) ×2
GOWN STRL REUS W/ TWL LRG LVL3 (GOWN DISPOSABLE) ×3 IMPLANT
GOWN STRL REUS W/TWL LRG LVL3 (GOWN DISPOSABLE) ×9
HIBICLENS CHG 4% 4OZ BTL (MISCELLANEOUS) IMPLANT
KIT TURNOVER KIT B (KITS) ×3 IMPLANT
LEGGING LITHOTOMY PAIR STRL (DRAPES) ×3 IMPLANT
NEEDLE 22X1 1/2 (OR ONLY) (NEEDLE) ×3 IMPLANT
NEEDLE HYPO 22GX1.5 SAFETY (NEEDLE) ×3 IMPLANT
NEEDLE SPNL 22GX3.5 QUINCKE BK (NEEDLE) ×3 IMPLANT
NS IRRIG 1000ML POUR BTL (IV SOLUTION) ×6 IMPLANT
PACK ABDOMINAL GYN (CUSTOM PROCEDURE TRAY) ×3 IMPLANT
PAD OB MATERNITY 4.3X12.25 (PERSONAL CARE ITEMS) ×3 IMPLANT
PENCIL BUTTON HOLSTER BLD 10FT (ELECTRODE) ×3 IMPLANT
PROTECTOR NERVE ULNAR (MISCELLANEOUS) ×3 IMPLANT
SHEET MEDIUM DRAPE 40X70 STRL (DRAPES) ×6 IMPLANT
SPONGE LAP 18X18 RF (DISPOSABLE) IMPLANT
STRIP CLOSURE SKIN 1/2X4 (GAUZE/BANDAGES/DRESSINGS) IMPLANT
STRIP CLOSURE SKIN 1/4X3 (GAUZE/BANDAGES/DRESSINGS) IMPLANT
SUT CHROMIC 0 CT 1 (SUTURE) IMPLANT
SUT MNCRL 0 MO-4 VIOLET 18 CR (SUTURE) IMPLANT
SUT MNCRL 0 VIOLET 6X18 (SUTURE) IMPLANT
SUT MON AB 2-0 CT1 27 (SUTURE) IMPLANT
SUT MON AB-0 CT1 36 (SUTURE) ×12 IMPLANT
SUT MONOCRYL 0 6X18 (SUTURE)
SUT MONOCRYL 0 MO 4 18  CR/8 (SUTURE)
SUT PDS AB 0 CTX 60 (SUTURE) IMPLANT
SUT PLAIN 2 0 XLH (SUTURE) IMPLANT
SUT VIC AB 4-0 KS 27 (SUTURE) IMPLANT
SUT VICRYL 0 TIES 12 18 (SUTURE) ×3 IMPLANT
SUT VICRYL RAPIDE 2 0 (SUTURE) ×3 IMPLANT
SYR BULB IRRIG 60ML STRL (SYRINGE) ×3 IMPLANT
SYR CONTROL 10ML LL (SYRINGE) ×6 IMPLANT
TOWEL GREEN STERILE FF (TOWEL DISPOSABLE) ×6 IMPLANT
TRAY FOLEY W/BAG SLVR 14FR (SET/KITS/TRAYS/PACK) IMPLANT
YANKAUER SUCT BULB TIP NO VENT (SUCTIONS) ×3 IMPLANT

## 2020-08-31 NOTE — Discharge Instructions (Signed)
Follow up in Dr Christen Butter office in 1-2 weeks Call office for heavy vaginal bleeding, increasing pain, temperature of 100.4 degrees A prescription for Lysteda tablets has been sent to your [place]. This is to use only if you have heavy bleeding. Also call our office if heavy bleeding occurs. No pelvic entry No tub baths, shower is OK No heavy lifting and no driving

## 2020-08-31 NOTE — Op Note (Signed)
NAME: Monique Bowman, BONK MEDICAL RECORD [id number] ACCOUNT [id number] DATE OF BIRTH:April 01, 1986 FACILITY: [facility] LOCATION: [place], [place]  OPERATIVE REPORT  DATE OF PROCEDURE:  08/31/2020  PREOPERATIVE DIAGNOSES: 1.  Prolapsed uterine fibroid. 2.  Menorrhagia.  POSTOPERATIVE DIAGNOSES: 1.  Prolapsed uterine fibroid. 2.  Menorrhagia.  PROCEDURE:  Transvaginal uterine myomectomy.  SURGEON:  Everlene Farrier II, MD  ANESTHESIA:  General with endotracheal intubation.  ESTIMATED BLOOD LOSS:  Drops.  SPECIMENS:  Fibroid to pathology.  DRAINS:  None.  INDICATIONS AND CONSENT:  This patient is a 34 year old, G0, P0, who underwent abdominal uterine myomectomy on 08/22/2020.  She was discharged home the next day with a hemoglobin of 6.4 and stable condition.  She did well until today when she awoke  having heavy bleeding and passing clots.  Evaluation in the office revealed a capillary hemoglobin of approximately 5.7 and a mass filling the upper vagina.  Office ultrasound noted an 8.9 cm mass in the upper vagina with some blood flow consistent most  likely with a prolapsed fibroid.  Recommendation for vaginal approach for myomectomy was discussed with the patient.  Possible laparotomy and hysterectomy to control bleeding was also discussed.  Potential risks and complications were reviewed  preoperatively including but not limited to infection, organ damage, bleeding requiring transfusion of blood products with HIV and hepatitis acquisition, DVT, PE, pneumonia, laparotomy as above.  The patient states she understands and agrees.  Consent  was signed on the chart.  DESCRIPTION OF PROCEDURE:  The patient was taken emergently to the operating room.  She was placed in the dorsal supine position, and general anesthesia was secured via endotracheal intubation.  An i-STAT has been drawn while readying for the case and  returns with a hemoglobin of approximately 8.5.  She  was then placed in the dorsal lithotomy position.  She was prepped abdominally with ChloraPrep, vaginally with Betadine.  The bladder was straight catheterized and she was draped in a sterile fashion.   Timeout was undertaken.  The mass can be palpated with the examining fingers, but access was very limited by a virginal introitus.  Therefore, she was injected at the 6 o'clock position of the introitus with 4 mL of 0.25% Marcaine and a midline  episiotomy incision for a shallow second-degree incision was made.  This allowed access to the circumference of the fibroid, which could be palpated to be on a stalk prolapsing through the cervix.  This allowed me to place a Kelly clamp on the stalk and  then using Satinsky scissors and cutting distal to the clamp I removed the fibroid.  Examination revealed it to be coming from a broad based stalk of about 1.5 cm diameter at the 12 o'clock position in the upper one third of the cervical canal.  Because  the cervix was dilated with the fibroid, there was good visualization for this.  It was ligated with 3 separate free ties of 0 Vicryl. The episiotomy was closed with 2-0 Vicryl Rapide in the standard fashion. Observation revealed excellent hemostasis.  The patient did receive preoperative antibiotics with Ancef 2 grams IV.   All counts were correct, and she was awakened in stable condition.  IN/NUANCE  D:08/31/2020 T:08/31/2020 JOB:[id number]

## 2020-08-31 NOTE — Transfer of Care (Signed)
Immediate Anesthesia Transfer of Care Note  Patient: Monique Bowman  Procedure(s) Performed: VAGINAL MYOMECTOMY (N/A )  Patient Location: covid positive, OR recover  Anesthesia Type:General  Level of Consciousness: awake, alert  and oriented  Airway & Oxygen Therapy: Patient Spontanous Breathing  Post-op Assessment: Report given to RN and Post -op Vital signs reviewed and stable  Post vital signs: Reviewed and stable  Last Vitals:  Vitals Value Taken Time  BP    Temp    Pulse    Resp    SpO2      Last Pain: There were no vitals filed for this visit.       Complications: No complications documented.

## 2020-08-31 NOTE — H&P (Signed)
Monique Bowman is an 34 y.o. female. S/P abdominal myomectomy 08/22/20. Discharged home with Hgb of 6.4 and asymptomatic. Did well until today when she started passing clots and having heavy bleeding. Evaluation in office noted abdominal incision to be healing well. She had blood onto the floor with speculum exam and a mass filing upper vagina. U/S in office noted post op uterus with 2.0 and 4. 0 cm fibroids. There is an 8.9 cm mass in upper vagina with some blood flow. She notes fatigue. Denies fever/chills, syncope.  Pertinent Gynecological History: Menses: flow is excessive with use of many pads or tampons on heaviest days Bleeding: dysfunctional uterine bleeding Contraception: none DES exposure: denies Blood transfusions: see above Sexually transmitted diseases: no past history Previous GYN Procedures: see above  Last mammogram: Date:  Last pap:  Date:  OB History: G0, P0   Menstrual History: Menarche age: unknown Patient's last menstrual period was 08/10/2020.    Past Medical History:  Diagnosis Date  . Anemia    on iron  . Heart murmur   . Known health problems: none     Past Surgical History:  Procedure Laterality Date  . MYOMECTOMY N/A 08/22/2020   Procedure: ABDOMINAL MYOMECTOMY;  Surgeon: Dian Queen, MD;  Location: [facility];  Service: Gynecology;  Laterality: N/A;  . NO PAST SURGERIES      Family History  Problem Relation Age of Onset  . Diabetes Mother   . Breast cancer Mother        remission  . Ovarian cancer Mother        remission  . Congenital heart disease Brother        Died at age 49    Social History:  reports that she has never smoked. She has never used smokeless tobacco. She reports that she does not drink alcohol and does not use drugs.  Allergies: No Known Allergies  Medications Prior to Admission  Medication Sig Dispense Refill Last Dose  . acetaminophen (TYLENOL) 500 MG tablet Take 1,000 mg by mouth every 6 (six) hours as needed for  mild pain or moderate pain.     . ferrous sulfate 325 (65 FE) MG tablet Take 1 tablet (325 mg total) by mouth daily. 90 tablet 0   . ibuprofen (ADVIL) 800 MG tablet Take 1 tablet (800 mg total) by mouth every 8 (eight) hours. 30 tablet 0   . oxyCODONE (OXY IR/ROXICODONE) 5 MG immediate release tablet Take 1-2 tablets (5-10 mg total) by mouth every 4 (four) hours as needed for moderate pain. 30 tablet 0     Review of Systems  Constitutional: Negative for fever.    Last menstrual period 08/10/2020. Physical Exam Cardiovascular:     Rate and Rhythm: Normal rate.  Pulmonary:     Effort: Pulmonary effort is normal.  Genitourinary:    Comments: Mass filing upper vagina    Results for orders placed or performed during the hospital encounter of 08/31/20 (from the past 24 hour(s))  Type and screen     Status: None (Preliminary result)   Collection Time: 08/31/20  4:42 PM  Result Value Ref Range   ABO/RH(D) PENDING    Antibody Screen PENDING    Sample Expiration 09/03/2020,2359    Unit Number [id number]    Blood Component Type RED CELLS,LR    Unit division 00    Status of Unit ISSUED    Unit tag comment EMERGENCY RELEASE    Transfusion Status      OK  TO TRANSFUSE Performed at [facility], [place] [address], [place], [place] [address]    Crossmatch Result PENDING    Unit Number [id number]    Blood Component Type RCLI PHER 2    Unit division 00    Status of Unit ISSUED    Unit tag comment EMERGENCY RELEASE    Transfusion Status OK TO TRANSFUSE    Crossmatch Result PENDING    Unit Number [id number]    Blood Component Type RBC LR PHER2    Unit division 00    Status of Unit ISSUED    Unit tag comment EMERGENCY RELEASE    Transfusion Status OK TO TRANSFUSE    Crossmatch Result PENDING    Unit Number [id number]    Blood Component Type RED CELLS,LR    Unit division 00    Status of Unit ISSUED    Unit tag comment EMERGENCY RELEASE    Transfusion Status OK TO  TRANSFUSE    Crossmatch Result PENDING     No results found.  Assessment/Plan: 34 yo G0P0 S/P abdominal myomectomy 08/22/20 Recurrent heavy vaginal bleeding, anemia and probable prolapsed fibroid into upper vagina D/W patient transfusion of PRBC and risks including HIV/Hep and transfusion reaction D/W vaginal myomectomy, possible laparotomy, possible hysterectomy. She wants to retain fertility and hysterectomy is last resort. D/W risks of surgery including infection, organ damage, bleeding/transfusion-HIV/Hep, DVT/PE, pneumonia, laparotomy, hysterectomy. She states she understands and agrees.  Shon Millet II 08/31/2020, 5:12 PM

## 2020-08-31 NOTE — Anesthesia Procedure Notes (Signed)
Procedure Name: Intubation Date/Time: 08/31/2020 5:20 PM Performed by: Thelma Comp, CRNA Pre-anesthesia Checklist: Patient identified, Emergency Drugs available, Suction available and Patient being monitored Patient Re-evaluated:Patient Re-evaluated prior to induction Oxygen Delivery Method: Circle System Utilized Preoxygenation: Pre-oxygenation with 100% oxygen Induction Type: IV induction, Cricoid Pressure applied and Rapid sequence Ventilation: Mask ventilation without difficulty Laryngoscope Size: Mac and 3 Grade View: Grade I Tube type: Oral Tube size: 7.0 mm Number of attempts: 1 Airway Equipment and Method: Stylet Placement Confirmation: ETT inserted through vocal cords under direct vision,  positive ETCO2 and breath sounds checked- equal and bilateral Secured at: 21 cm Tube secured with: Tape Dental Injury: Teeth and Oropharynx as per pre-operative assessment

## 2020-08-31 NOTE — Anesthesia Postprocedure Evaluation (Signed)
Anesthesia Post Note  Patient: MKENZIE DOTTS  Procedure(s) Performed: VAGINAL MYOMECTOMY (N/A )     Patient location during evaluation: PACU Anesthesia Type: General Level of consciousness: awake and alert Pain management: pain level controlled Vital Signs Assessment: post-procedure vital signs reviewed and stable Respiratory status: spontaneous breathing, nonlabored ventilation, respiratory function stable and patient connected to nasal cannula oxygen Cardiovascular status: blood pressure returned to baseline and stable Postop Assessment: no apparent nausea or vomiting Anesthetic complications: no   No complications documented.  Last Vitals:  Vitals:   08/31/20 1945 08/31/20 2000  BP: 102/64 105/65  Pulse: 87 88  Temp:  36.7 C  SpO2: 100% 100%    Last Pain:  Vitals:   08/31/20 2000  PainSc: 0-No pain                 Effie Berkshire

## 2020-08-31 NOTE — Anesthesia Preprocedure Evaluation (Addendum)
Anesthesia Evaluation  Preop documentation limited or incomplete due to emergent nature of procedure.  Airway Mallampati: II       Dental   Pulmonary    Pulmonary exam normal        Cardiovascular  Rhythm:Regular Rate:Tachycardia     Neuro/Psych    GI/Hepatic   Endo/Other    Renal/GU      Musculoskeletal   Abdominal   Peds  Hematology  (+) anemia ,   Anesthesia Other Findings  S/p myomectomy on 08/22/20. Presented to GYN office with profuse bleeding. To OR for emergent exploration.  Reproductive/Obstetrics                            Anesthesia Physical Anesthesia Plan  ASA: IV and emergent  Anesthesia Plan: General   Post-op Pain Management:    Induction: Intravenous, Rapid sequence and Cricoid pressure planned  PONV Risk Score and Plan: 4 or greater and Ondansetron, Dexamethasone, Treatment may vary due to age or medical condition and Midazolam  Airway Management Planned: Oral ETT  Additional Equipment:   Intra-op Plan:   Post-operative Plan: Possible Post-op intubation/ventilation and Extubation in OR  Informed Consent: I have reviewed the patients History and Physical, chart, labs and discussed the procedure including the risks, benefits and alternatives for the proposed anesthesia with the patient or authorized representative who has indicated his/her understanding and acceptance.       Plan Discussed with:   Anesthesia Plan Comments:       Anesthesia Quick Evaluation

## 2020-08-31 NOTE — Progress Notes (Signed)
08/31/2020  6:29 PM  PATIENT:  Monique Bowman  34 y.o. female  PRE-OPERATIVE DIAGNOSIS:  Fibroids and Bleeding  POST-OPERATIVE DIAGNOSIS:  Fibroids and Bleeding  PROCEDURE:  Procedure(s): VAGINAL MYOMECTOMY (N/A)  SURGEON:  Surgeon(s) and Role:    * Everlene Farrier, MD - Primary  PHYSICIAN ASSISTANT:   ASSISTANTS: none   ANESTHESIA:   general  EBL:  20 mL   BLOOD ADMINISTERED:none  DRAINS: none   LOCAL MEDICATIONS USED:  MARCAINE    and Amount: 4 ml  SPECIMEN:  Source of Specimen:  uterine fibroid  DISPOSITION OF SPECIMEN:  PATHOLOGY  COUNTS:  YES  TOURNIQUET:  * No tourniquets in log *  DICTATION: .Other Dictation: Dictation Number [id number]  PLAN OF CARE: Discharge to home after PACU  PATIENT DISPOSITION:  PACU - hemodynamically stable.   Delay start of Pharmacological VTE agent (>24hrs) due to surgical blood loss or risk of bleeding: not applicable

## 2020-09-01 ENCOUNTER — Encounter (HOSPITAL_COMMUNITY): Payer: Self-pay | Admitting: Obstetrics and Gynecology

## 2020-09-01 LAB — POCT I-STAT EG7
Acid-base deficit: 1 mmol/L (ref 0.0–2.0)
Bicarbonate: 24.2 mmol/L (ref 20.0–28.0)
Calcium, Ion: 1.27 mmol/L (ref 1.15–1.40)
HCT: 25 % — ABNORMAL LOW (ref 36.0–46.0)
Hemoglobin: 8.5 g/dL — ABNORMAL LOW (ref 12.0–15.0)
O2 Saturation: 31 %
Potassium: 4.1 mmol/L (ref 3.5–5.1)
Sodium: 136 mmol/L (ref 135–145)
TCO2: 25 mmol/L (ref 22–32)
pCO2, Ven: 40.6 mmHg — ABNORMAL LOW (ref 44.0–60.0)
pH, Ven: 7.384 (ref 7.250–7.430)
pO2, Ven: 20 mmHg — CL (ref 32.0–45.0)

## 2020-09-03 LAB — BPAM RBC
Blood Product Expiration Date: 202112302359
Blood Product Expiration Date: 202112302359
Blood Product Expiration Date: 202201082359
Blood Product Expiration Date: 202201082359
Blood Product Expiration Date: 202201152359
Blood Product Expiration Date: 202201152359
Blood Product Expiration Date: 202201262359
Blood Product Expiration Date: 202201262359
ISSUE DATE / TIME: 202112231643
ISSUE DATE / TIME: 202112231643
ISSUE DATE / TIME: 202112231643
ISSUE DATE / TIME: 202112231751
ISSUE DATE / TIME: 202112231751
ISSUE DATE / TIME: 202112232230
ISSUE DATE / TIME: 202112250031
ISSUE DATE / TIME: 202112251041
Unit Type and Rh: 5100
Unit Type and Rh: 5100
Unit Type and Rh: 5100
Unit Type and Rh: 5100
Unit Type and Rh: 9500
Unit Type and Rh: 9500
Unit Type and Rh: 9500
Unit Type and Rh: 9500

## 2020-09-03 LAB — TYPE AND SCREEN
ABO/RH(D): O POS
Antibody Screen: NEGATIVE
Unit division: 0
Unit division: 0
Unit division: 0
Unit division: 0
Unit division: 0
Unit division: 0
Unit division: 0
Unit division: 0

## 2020-09-04 LAB — SURGICAL PATHOLOGY

## 2020-09-05 NOTE — Discharge Summary (Signed)
Day of Admission: 08/31/20 Day of Discharge: 08/31/20 Admitting diagnosis:vaginal mass Discharge diagnosis: cervical prolapse of uterine fibroid Procedure: Vaginal resection of prolapsed uterine fibroid Labs:  Recent Results (from the past 2160 hour(s))  Basic metabolic panel     Status: None   Collection Time: 08/09/20 12:51 PM  Result Value Ref Range   Sodium 138 135 - 145 mmol/L   Potassium 3.7 3.5 - 5.1 mmol/L   Chloride 103 98 - 111 mmol/L   CO2 24 22 - 32 mmol/L   Glucose, Bld 90 70 - 99 mg/dL    Comment: Glucose reference range applies only to samples taken after fasting for at least 8 hours.   BUN 12 6 - 20 mg/dL   Creatinine, Ser 0.58 0.44 - 1.00 mg/dL   Calcium 9.3 8.9 - 10.3 mg/dL   GFR, Estimated >60 >60 mL/min    Comment: (NOTE) Calculated using the CKD-EPI Creatinine Equation (2021)    Anion gap 11 5 - 15    Comment: Performed at [facility], [place] [address], [place], [place] [address]  CBC     Status: Abnormal   Collection Time: 08/09/20 12:51 PM  Result Value Ref Range   WBC 7.3 4.0 - 10.5 K/uL   RBC 2.91 (L) 3.87 - 5.11 MIL/uL   Hemoglobin 6.3 (LL) 12.0 - 15.0 g/dL    Comment: REPEATED TO VERIFY THIS CRITICAL RESULT HAS VERIFIED AND BEEN CALLED TO LYNARD,S. RN BY NICOLE Wilson ON 12 01 2021 AT 1324, AND HAS BEEN READ BACK. CRITICAL RESULT VERIFIED    HCT 22.9 (L) 36.0 - 46.0 %   MCV 78.7 (L) 80.0 - 100.0 fL   MCH 21.6 (L) 26.0 - 34.0 pg   MCHC 27.5 (L) 30.0 - 36.0 g/dL   RDW 19.8 (H) 11.5 - 15.5 %   Platelets 621 (H) 150 - 400 K/uL   nRBC 0.0 0.0 - 0.2 %    Comment: Performed at [facility], [place] [address], [place], [place] [address]  Type and screen [place]     Status: None   Collection Time: 08/09/20 12:51 PM  Result Value Ref Range   ABO/RH(D) O POS    Antibody Screen NEG    Sample Expiration 08/12/2020,2359    Unit Number [id number]    Blood Component Type RED CELLS,LR    Unit  division 00    Status of Unit ISSUED,FINAL    Transfusion Status OK TO TRANSFUSE    Crossmatch Result Compatible    Unit Number [id number]    Blood Component Type RED CELLS,LR    Unit division 00    Status of Unit ISSUED,FINAL    Transfusion Status OK TO TRANSFUSE    Crossmatch Result      Compatible Performed at [facility], [place] [address], [place], [place] [address]   BPAM RBC     Status: None   Collection Time: 08/09/20 12:51 PM  Result Value Ref Range   ISSUE DATE / TIME [id number]    Blood Product Unit Number [id number]    PRODUCT CODE [id number]    Unit Type and Rh 5100    Blood Product Expiration Date 202201022359    ISSUE DATE / TIME O152772    Blood Product Unit Number [id number]    PRODUCT CODE [id number]    Unit Type and Rh 5100    Blood Product Expiration Date [id number]:[id number]   I-Stat beta hCG blood, ED     Status:  None   Collection Time: 08/09/20 12:55 PM  Result Value Ref Range   I-stat hCG, quantitative <5.0 <5 mIU/mL   Comment 3            Comment:   GEST. AGE      CONC.  (mIU/mL)   <=1 WEEK        5 - 50     2 WEEKS       50 - 500     3 WEEKS       100 - 10,000     4 WEEKS     1,000 - 30,000        FEMALE AND NON-PREGNANT FEMALE:     LESS THAN 5 mIU/mL   Prepare RBC (crossmatch)     Status: None   Collection Time: 08/09/20  1:33 PM  Result Value Ref Range   Order Confirmation      ORDER PROCESSED BY BLOOD BANK Performed at [place] [address] [place], [place] [address]   CBC per protocol     Status: Abnormal   Collection Time: 08/21/20  8:55 AM  Result Value Ref Range   WBC 5.5 4.0 - 10.5 K/uL   RBC 3.22 (L) 3.87 - 5.11 MIL/uL   Hemoglobin 7.4 (L) 12.0 - 15.0 g/dL   HCT 25.7 (L) 36.0 - 46.0 %   MCV 79.8 (L) 80.0 - 100.0 fL   MCH 23.0 (L) 26.0 - 34.0 pg   MCHC 28.8 (L) 30.0 - 36.0 g/dL   RDW 18.5 (H) 11.5 - 15.5 %   Platelets 601 (H) 150 - 400 K/uL   nRBC 0.0 0.0 - 0.2 %    Comment:  Performed at [facility], [place] [address] [place], [place] [address]  Pregnancy, urine per protocol     Status: None   Collection Time: 08/21/20  8:55 AM  Result Value Ref Range   Preg Test, Ur NEGATIVE NEGATIVE    Comment:        THE SENSITIVITY OF THIS METHODOLOGY IS >20 mIU/mL. Performed at [facility], [place] [address], [place], [place] [address]   Prepare RBC (crossmatch)     Status: None   Collection Time: 08/21/20  8:55 AM  Result Value Ref Range   Order Confirmation      ORDER PROCESSED BY BLOOD BANK Performed at [facility], [place] [address], [place], [place] [address]   Type and screen [place]     Status: None   Collection Time: 08/21/20  8:58 AM  Result Value Ref Range   ABO/RH(D) O POS    Antibody Screen NEG    Sample Expiration 08/25/2020,2359    Extend sample reason NO TRANSFUSIONS OR PREGNANCY IN THE PAST 3 MONTHS    Unit Number [id number]    Blood Component Type RED CELLS,LR    Unit division 00    Status of Unit ISSUED,FINAL    Transfusion Status OK TO TRANSFUSE    Crossmatch Result      Compatible Performed at [facility], [place] [address] [place], [place] [address]    Unit Number [id number]    Blood Component Type RED CELLS,LR    Unit division 00    Status of Unit ISSUED,FINAL    Transfusion Status OK TO TRANSFUSE    Crossmatch Result Compatible   BPAM RBC     Status: None   Collection Time: 08/21/20  8:58 AM  Result  Value Ref Range   ISSUE DATE / TIME 024097353299    Blood Product Unit Number [id number]    PRODUCT CODE [id number]    Unit Type and Rh 5100    Blood Product Expiration Date 202201122359    ISSUE DATE / TIME [id number]    Blood Product Unit Number [id number]    PRODUCT CODE E0382V00    Unit Type and Rh 5100    Blood Product Expiration Date 858850277412   SARS CORONAVIRUS 2 (TAT 6-24 HRS) Nasopharyngeal Nasopharyngeal Swab      Status: None   Collection Time: 08/21/20  9:51 AM   Specimen: Nasopharyngeal Swab  Result Value Ref Range   SARS Coronavirus 2 NEGATIVE NEGATIVE    Comment: (NOTE) SARS-CoV-2 target nucleic acids are NOT DETECTED.  The SARS-CoV-2 RNA is generally detectable in upper and lower respiratory specimens during the acute phase of infection. Negative results do not preclude SARS-CoV-2 infection, do not rule out co-infections with other pathogens, and should not be used as the sole basis for treatment or other patient management decisions. Negative results must be combined with clinical observations, patient history, and epidemiological information. The expected result is Negative.  Fact Sheet for Patients: HairSlick.no  Fact Sheet for Healthcare Providers: quierodirigir.com  This test is not yet approved or cleared by the [place] FDA and  has been authorized for detection and/or diagnosis of SARS-CoV-2 by FDA under an Emergency Use Authorization (EUA). This EUA will remain  in effect (meaning this test can be used) for the duration of the COVID-19 declaration under Se ction 564(b)(1) of the Act, 21 U.S.C. section 360bbb-3(b)(1), unless the authorization is terminated or revoked sooner.  Performed at [facility], [address] [place], [place] [address]   Surgical pathology     Status: None   Collection Time: 08/22/20  8:06 AM  Result Value Ref Range   SURGICAL PATHOLOGY      SURGICAL PATHOLOGY CASE: [id number] PATIENT: [facility] Surgical Pathology Report     Clinical History: Uterine fibroids (crm)     FINAL MICROSCOPIC DIAGNOSIS:  A. UTERINE FIBROIDS, MYOMECTOMY: - Benign leiomyoma(s)     GROSS DESCRIPTION:  The specimen is received fresh and consists of a 791 g, 16.5 x 14.3 x 9.2 cm aggregate of tan-pink fibrous nodules.  Sectioning reveals tan-white, whorled cut surfaces.   Representative sections are submitted in 2 cassettes.  Lovey Newcomer 08/22/2020)    Final Diagnosis performed by Holley Bouche, MD.   Electronically signed 08/23/2020 Technical and / or Professional components performed at [facility], [address], [place], [place] [address].  Immunohistochemistry Technical component (if applicable) was performed at [facility]. [address], [place], [place] [address].   IMMUNOHISTOCHEMISTRY DISCLAIMER (if applicable): Some of these immunohistochemical stains ma y have been developed and the performance characteristics determine by [facility]. Some may not have been cleared or approved by the U.S. Food and Drug Administration. The FDA has determined that such clearance or approval is not necessary. This test is used for clinical purposes. It should not be regarded as investigational or for research. This laboratory is certified under the Clinical Laboratory Improvement Amendments of 1988 (CLIA-88) as qualified to perform high complexity clinical laboratory testing.  The controls stained appropriately.   CBC     Status: Abnormal   Collection Time: 08/23/20  5:42 AM  Result Value Ref Range   WBC 11.6 (H) 4.0 - 10.5 K/uL   RBC 2.57 (  L) 3.87 - 5.11 MIL/uL   Hemoglobin 6.4 (LL) 12.0 - 15.0 g/dL    Comment: REPEATED TO VERIFY THIS CRITICAL RESULT HAS VERIFIED AND BEEN CALLED TO SMITH,E. RN BY JOLANDE MECIAL ON 12 15 2021 AT 0633, AND HAS BEEN READ BACK. CRITICAL RESULT VERIFIED    HCT 21.3 (L) 36.0 - 46.0 %   MCV 82.9 80.0 - 100.0 fL   MCH 24.9 (L) 26.0 - 34.0 pg   MCHC 30.0 30.0 - 36.0 g/dL   RDW 18.0 (H) 11.5 - 15.5 %   Platelets 430 (H) 150 - 400 K/uL   nRBC 0.0 0.0 - 0.2 %    Comment: Performed at [facility], [place] [address], [place], [place] [address]  Type and screen     Status: None   Collection Time: 08/31/20  5:00 PM  Result Value Ref Range   ABO/RH(D) O POS     Antibody Screen NEG    Sample Expiration 09/03/2020,2359    Unit Number [id number]    Blood Component Type RED CELLS,LR    Unit division 00    Status of Unit REL FROM [facility]    Unit tag comment EMERGENCY RELEASE VERBAL ORDERS PER DR DR Gaetano Net    Transfusion Status OK TO TRANSFUSE    Crossmatch Result COMPATIBLE    Unit Number [id number]    Blood Component Type RCLI PHER 2    Unit division 00    Status of Unit REL FROM [facility]    Unit tag comment EMERGENCY RELEASE    Transfusion Status OK TO TRANSFUSE    Crossmatch Result COMPATIBLE    Unit Number [id number]    Blood Component Type RBC LR PHER2    Unit division 00    Status of Unit REL FROM [facility]    Unit tag comment EMERGENCY RELEASE    Transfusion Status OK TO TRANSFUSE    Crossmatch Result COMPATIBLE    Unit Number [id number]    Blood Component Type RED CELLS,LR    Unit division 00    Status of Unit REL FROM [facility]    Unit tag comment EMERGENCY RELEASE    Transfusion Status OK TO TRANSFUSE    Crossmatch Result COMPATIBLE    Unit Number [id number]    Blood Component Type RED CELLS,LR    Unit division 00    Status of Unit REL FROM [facility]    Transfusion Status OK TO TRANSFUSE    Crossmatch Result      Compatible Performed at [facility], [place] [address], [place], [place] [address]    Unit Number [id number]    Blood Component Type RED CELLS,LR    Unit division 00    Status of Unit REL FROM [facility]    Transfusion Status OK TO TRANSFUSE    Crossmatch Result Compatible    Unit Number [id number]    Blood Component Type RED CELLS,LR    Unit division 00    Status of Unit REL FROM [facility]    Transfusion Status OK TO TRANSFUSE    Crossmatch Result Compatible    Unit Number [id number]    Blood Component Type RED CELLS,LR    Unit division 00    Status of Unit REL FROM [facility]    Transfusion Status OK TO TRANSFUSE    Crossmatch Result Compatible   BPAM RBC     Status: None   Collection Time: 08/31/20   5:00 PM  Result Value Ref Range   ISSUE DATE / TIME  [id number]:[id number]    Blood Product Unit Number [id number]    PRODUCT CODE [id number]    Unit Type and Rh 9500    Blood Product Expiration Date 202112302359    ISSUE DATE / TIME P5225620    Blood Product Unit Number [id number]    Unit Type and Rh 9500    Blood Product Expiration Date 202112302359    ISSUE DATE / TIME P5225620    Blood Product Unit Number [id number]    Unit Type and Rh 9500    Blood Product Expiration Date T3980158    ISSUE DATE / TIME P5225620    Blood Product Unit Number [id number]    Unit Type and Rh 9500    Blood Product Expiration Date T3980158    ISSUE DATE / TIME T8764272    Blood Product Unit Number [id number]    PRODUCT CODE [id number]    Unit Type and Rh 5100    Blood Product Expiration Date U8482684    ISSUE DATE / TIME T8764272    Blood Product Unit Number [id number]    PRODUCT CODE [id number]    Unit Type and Rh 5100    Blood Product Expiration Date U8482684    ISSUE DATE / TIME J2399731    Blood Product Unit Number [id number]    PRODUCT CODE E0336V00    Unit Type and Rh 5100    Blood Product Expiration Date H2497719    ISSUE DATE / TIME F6780439    Blood Product Unit Number [id number]    PRODUCT CODE E0336V00    Unit Type and Rh 5100    Blood Product Expiration Date H2497719   POCT I-Stat EG7     Status: Abnormal   Collection Time: 08/31/20  5:10 PM  Result Value Ref Range   pH, Ven 7.384 7.250 - 7.430   pCO2, Ven 40.6 (L) 44.0 - 60.0 mmHg   pO2, Ven 20.0 (LL) 32.0 - 45.0 mmHg   Bicarbonate 24.2 20.0 - 28.0 mmol/L   TCO2 25 22 - 32 mmol/L   O2 Saturation 31.0 %   Acid-base deficit 1.0 0.0 - 2.0 mmol/L   Sodium 136 135 - 145 mmol/L   Potassium 4.1 3.5 - 5.1 mmol/L   Calcium, Ion 1.27 1.15 - 1.40 mmol/L   HCT 25.0 (L) 36.0 - 46.0 %   Hemoglobin 8.5 (L) 12.0 - 15.0 g/dL   Sample type VENOUS   Prepare RBC (crossmatch)      Status: None   Collection Time: 08/31/20  5:11 PM  Result Value Ref Range   Order Confirmation      ORDER PROCESSED BY BLOOD BANK Performed at [facility], [address] [place], [place] [address]   SARS Coronavirus 2 by RT PCR (hospital order, performed in [facility] hospital lab) Nasopharyngeal Nasopharyngeal Swab     Status: Abnormal   Collection Time: 08/31/20  5:20 PM   Specimen: Nasopharyngeal Swab  Result Value Ref Range   SARS Coronavirus 2 POSITIVE (A) NEGATIVE    Comment: RESULT CALLED TO, READ BACK BY AND VERIFIED WITH: Theotis Barrio, CRNA 08/31/20 AT 1906 SK  (NOTE) SARS-CoV-2 target nucleic acids are DETECTED  SARS-CoV-2 RNA is generally detectable in upper respiratory specimens  during the acute phase of infection.  Positive results are indicative  of the presence of the identified virus, but do not rule out bacterial infection or co-infection with other pathogens not detected by the test.  Clinical correlation with patient history and  other diagnostic information is  necessary to determine patient infection status.  The expected result is negative.  Fact Sheet for Patients:   StrictlyIdeas.no   Fact Sheet for Healthcare Providers:   BankingDealers.co.za    This test is not yet approved or cleared by the [place] FDA and  has been authorized for detection and/or diagnosis of SARS-CoV-2 by FDA under an Emergency Use Authorization (EUA).  This EUA will remain in effect (meaning this t est can be used) for the duration of  the COVID-19 declaration under Section 564(b)(1) of the Act, 21 U.S.C. section 360-bbb-3(b)(1), unless the authorization is terminated or revoked sooner.  Performed at [facility], [place] [address], [place], [place] [address]   Surgical pathology     Status: None   Collection Time: 08/31/20  5:49 PM  Result Value Ref Range   SURGICAL PATHOLOGY      SURGICAL PATHOLOGY CASE:  [id number] PATIENT: [facility] Surgical Pathology Report     Clinical History: Bleeding    FINAL MICROSCOPIC DIAGNOSIS: A. UTERINE, FIBROID, EXCISION: - Leiomyoma with focal acute inflammation and granulation tissue.  GROSS DESCRIPTION: The specimen is received fresh and consists of a 117 g, 17.2 x 5.7 x 5.5 cm nodule of tan-pink fibrous tissue.  Sectioning reveals a tan-white whorled cut surface.  Representative sections are submitted in 1 cassette.  Craig Staggers 09/01/2020)  Final Diagnosis performed by Claudette Laws, MD.   Electronically signed 09/04/2020 Technical component performed at [facility]. [facility], [place] [address], [place], [place] [address].  Professional component performed at [facility], [place] [address], [place], [place] [address].  Immunohistochemistry Technical component (if applicable) was performed at [facility] [address], [place], [place], [place] [address].    IMMUNOHISTOCHEMISTRY DISCLAIMER (if applicable): Some of these immunohistochemical stains may have been developed and the performance characteristics determine by [facility]. Some may not have been cleared or approved by the U.S. Food and Drug Administration. The FDA has determined that such clearance or approval is not necessary. This test is used for clinical purposes. It should not be regarded as investigational or for research. This laboratory is certified under the [place] (CLIA-88) as qualified to perform high complexity clinical laboratory testing.  The controls stained appropriately.   Condition on discharge:good Follow up in office 10 days

## 2020-09-06 ENCOUNTER — Telehealth: Payer: Self-pay | Admitting: Infectious Diseases

## 2020-09-06 NOTE — Telephone Encounter (Signed)
Called to discuss with patient about Covid symptoms and the use of a monoclonal antibody infusion for those with mild to moderate Covid symptoms and at a high risk of hospitalization.   Pt is qualified for the monoclonal antibody infusion, but due to medication shortages we cannot offer the pt the infusion at this time. This decision was based on clinical judgement as well as the the NIH Covid 19 treatment guidelines for treatment prioritization and equitable access.   Chart review completed.   Rexene Alberts, NP  09/06/2020 3:02 PM

## 2021-11-10 IMAGING — US US PELVIS COMPLETE
1 series · 14 of 25 positions shown · non-contrast
Comparison: None.

CLINICAL DATA: Abdominal fullness and bloating.  Palpable mass.

EXAM:
TRANSABDOMINAL ULTRASOUND OF PELVIS
TECHNIQUE: Transabdominal ultrasound examination of the pelvis was performed
including evaluation of the uterus, ovaries, adnexal regions, and
pelvic cul-de-sac.

[Series 1: us pelvis complete · 14 of 52 slices shown]
[im 1/52]
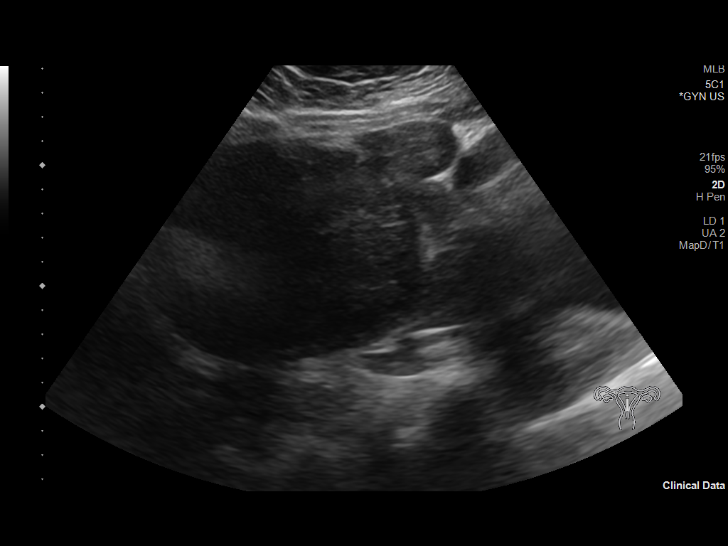
[im 5/52]
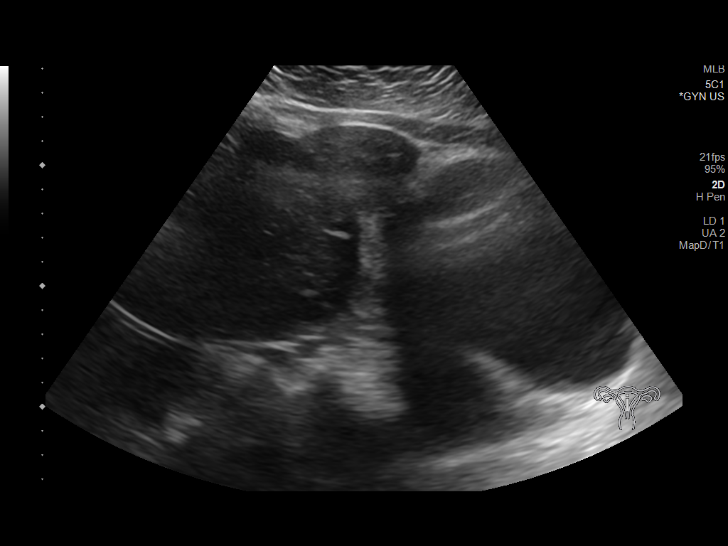
[im 9/52]
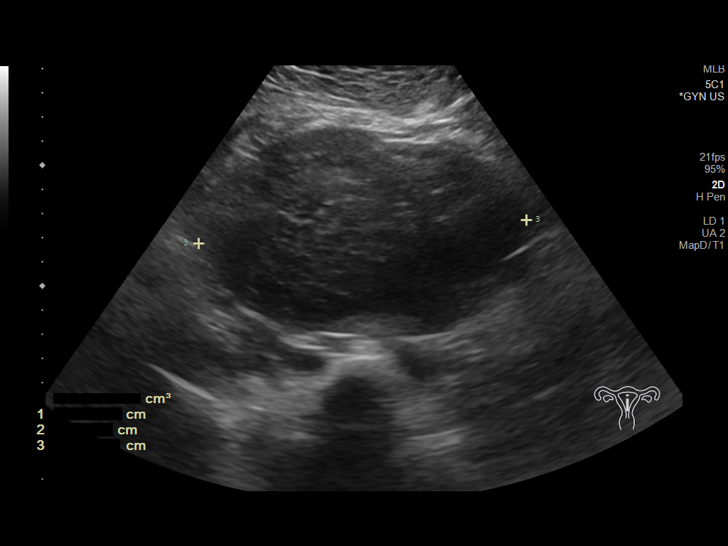
[im 13/52]
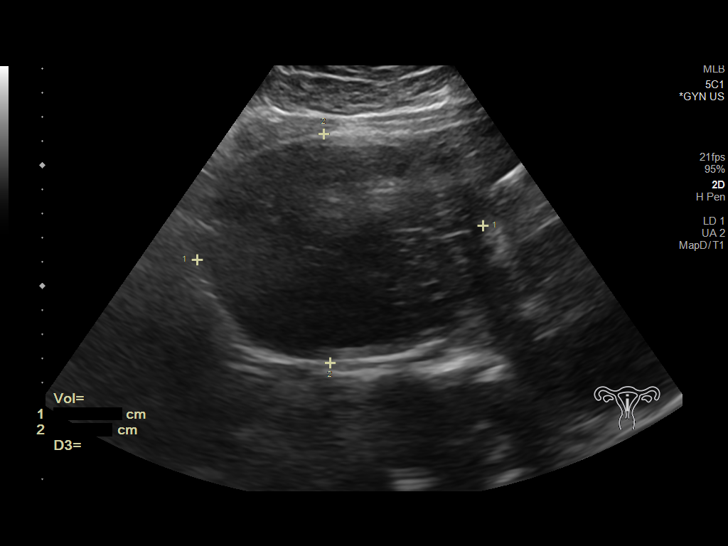
[im 18/52]
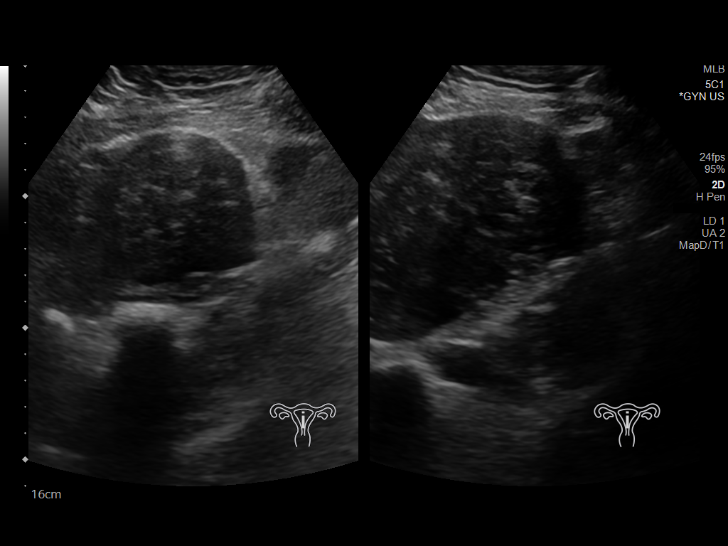
[im 20/52]
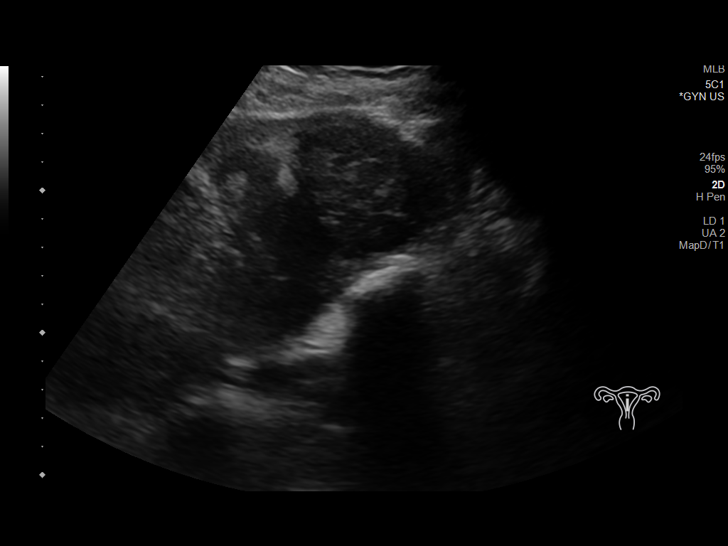
[im 24/52]
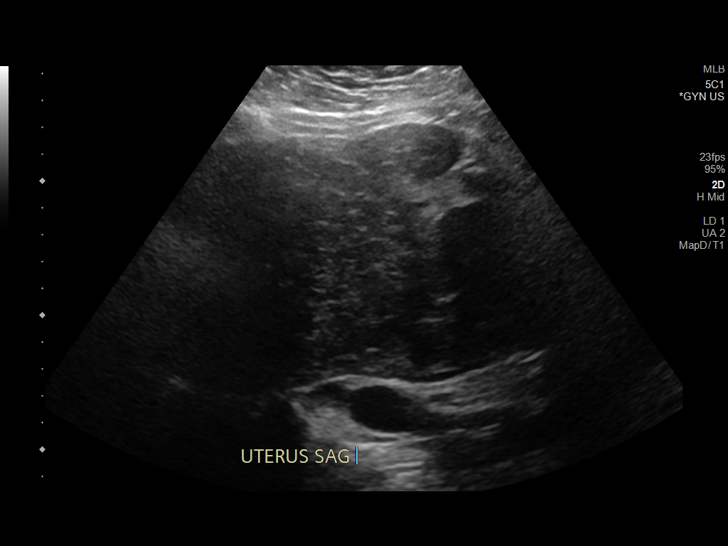
[im 28/52]
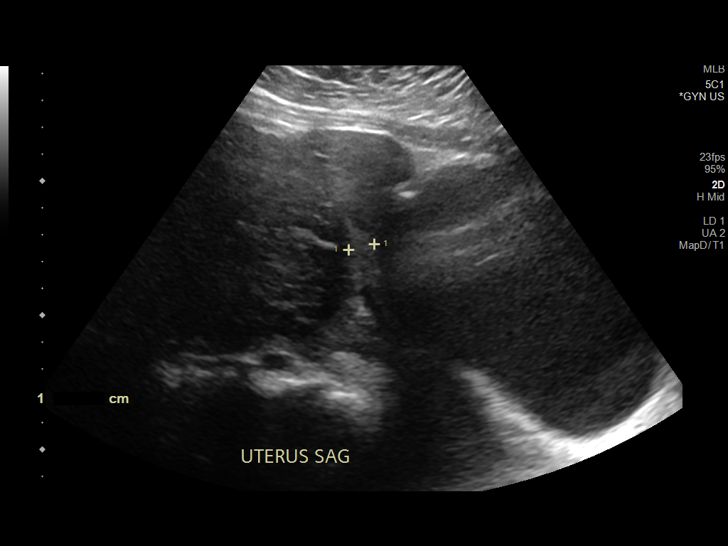
[im 32/52]
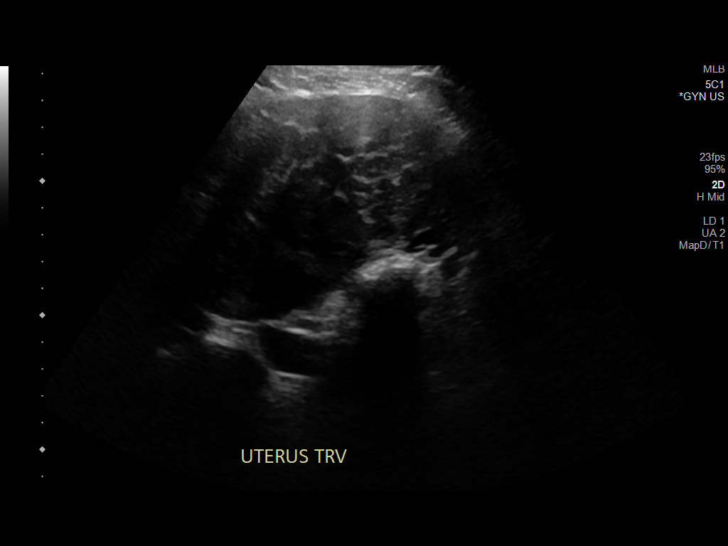
[im 35/52]
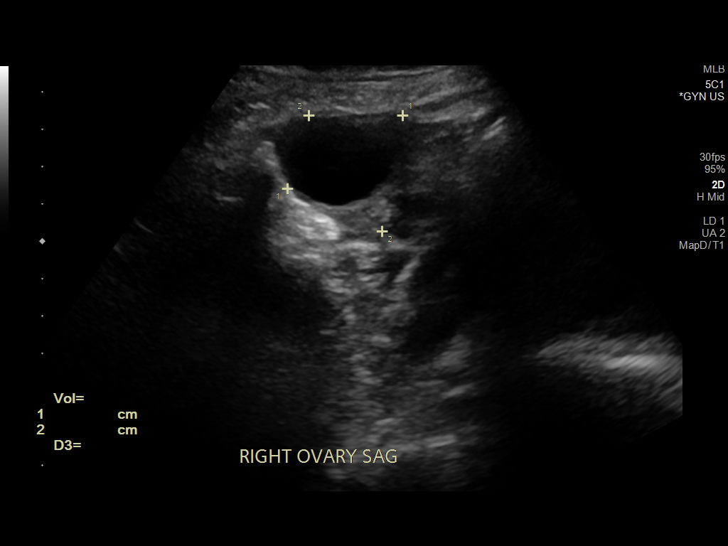
[im 39/52]
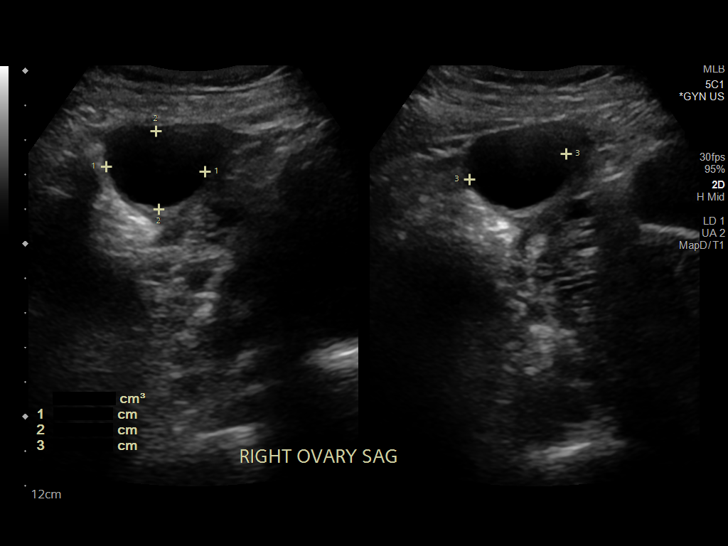
[im 43/52]
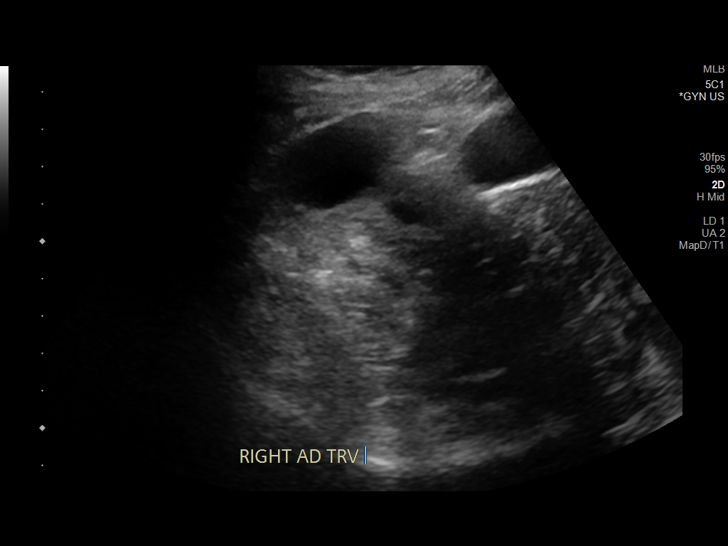
[im 47/52]
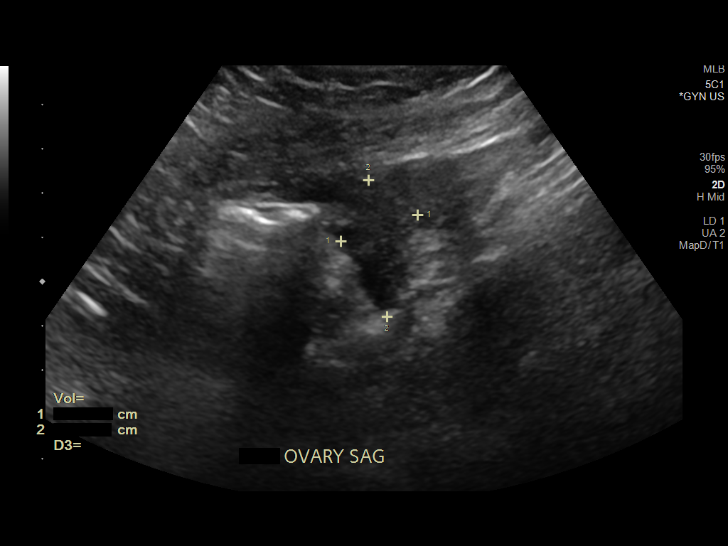
[im 52/52]
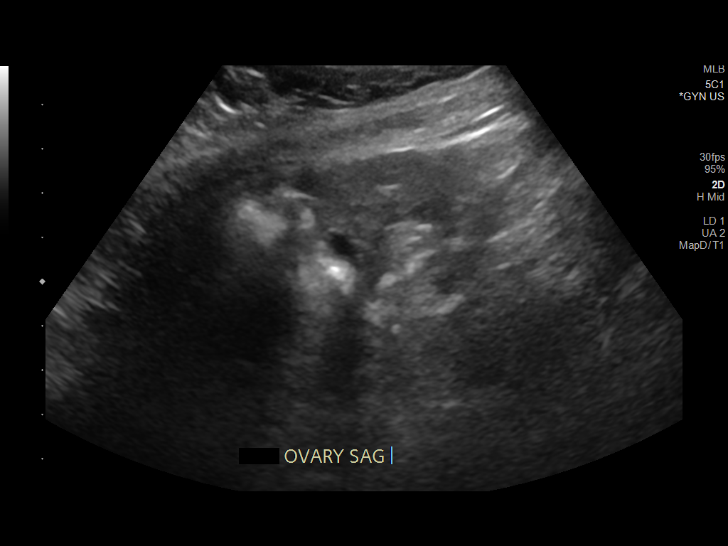

[14 of 25 positions shown; findings below may reference images not displayed]

FINDINGS: Uterus

Measurements: 21.3 x 8.9 x 13.6 cm = volume: 8112 mL. Several round
heterogeneous low echogenicity masses are noted within the uterine
body. Largest mass measures 12 cm. Two additional masses measure
and 4.4 cm. Findings consistent leiomyoma.

Endometrium

Thickness: 9.8 mm. Endometrium difficult to assess on transabdominal
evaluation. No focal abnormality identified.

Right ovary

Measurements: 3.7 x 3.7 x 4.0 cm = volume: 28 mL. 2.9 cm cyst
associated with the RIGHT ovary. Cyst is anechoic.

Left ovary

Measurements: 1.8 x 3.1 x 2.2 cm = volume: 6.6 mL. Normal

Other findings:  No abnormal free fluid.
IMPRESSION: 1. Multiple large uterine leiomyoma.
2. Endometrium difficult assess by transabdominal ultrasound but no
abnormality identified.
3. Normal ovaries.  Functional ovarian cyst of the RIGHT ovary.

## 2021-11-10 IMAGING — US US ABDOMEN COMPLETE
1 series · 14 of 25 positions shown · non-contrast
Comparison: Same day pelvic ultrasound

CLINICAL DATA: Abdominal mass

EXAM:
ABDOMEN ULTRASOUND COMPLETE

[Series 1: us abdomen complete · 14 of 78 slices shown]
[im 1/78]
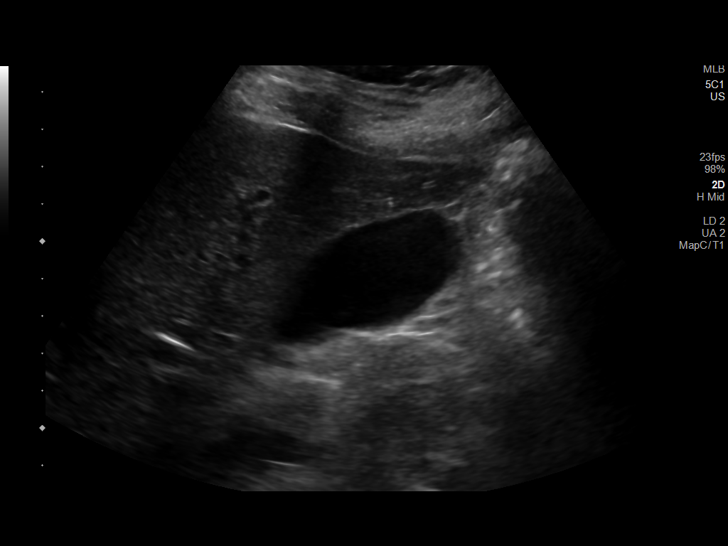
[im 7/78]
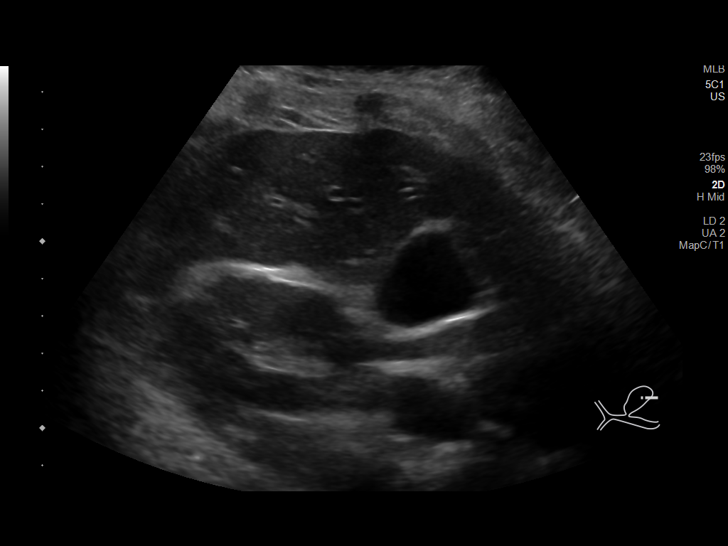
[im 13/78]
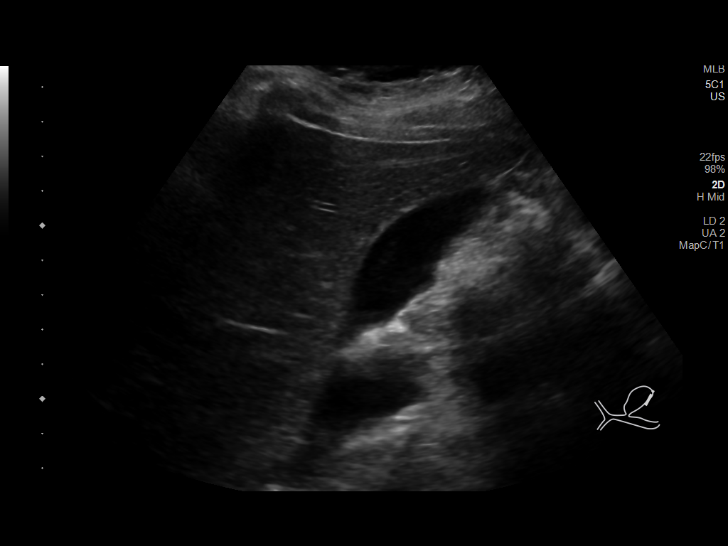
[im 20/78]
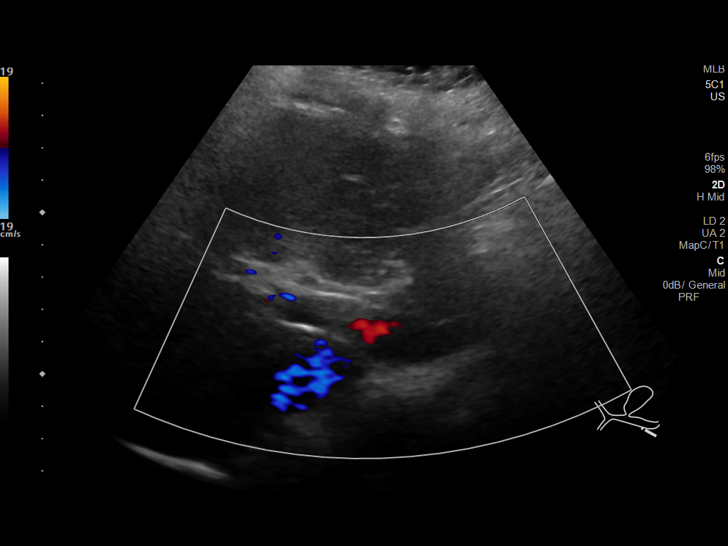
[im 26/78]
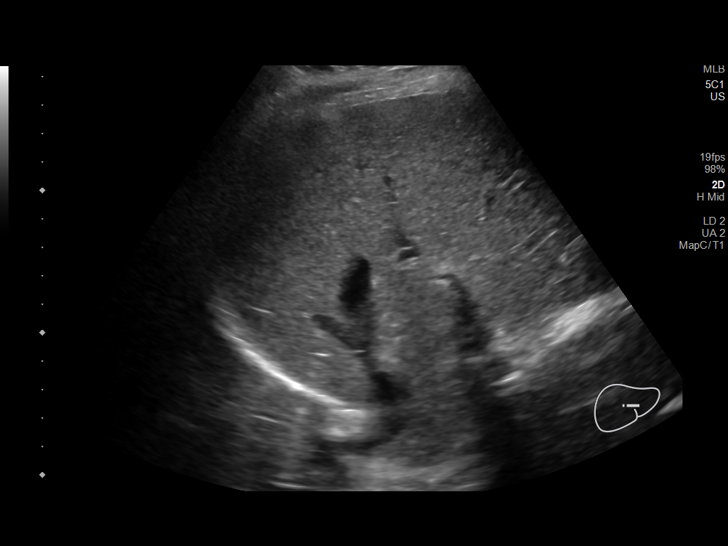
[im 29/78]
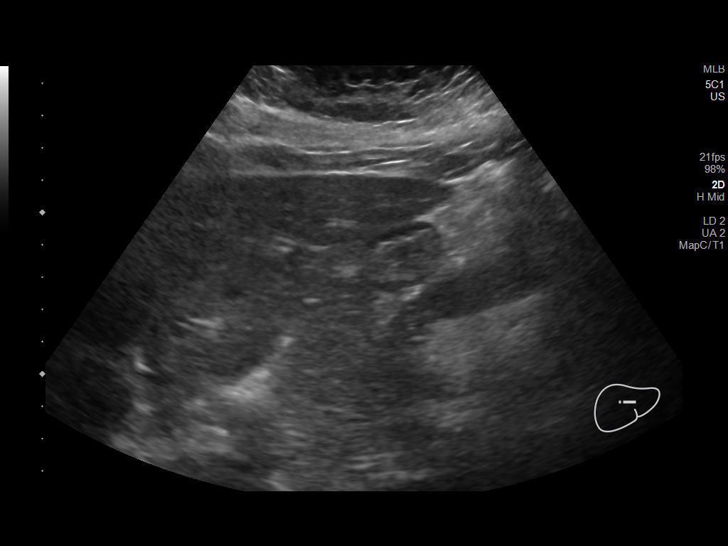
[im 36/78]
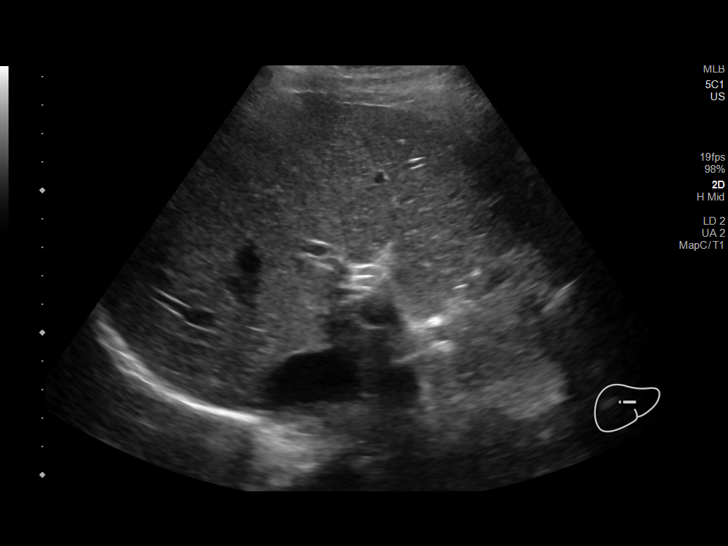
[im 42/78]
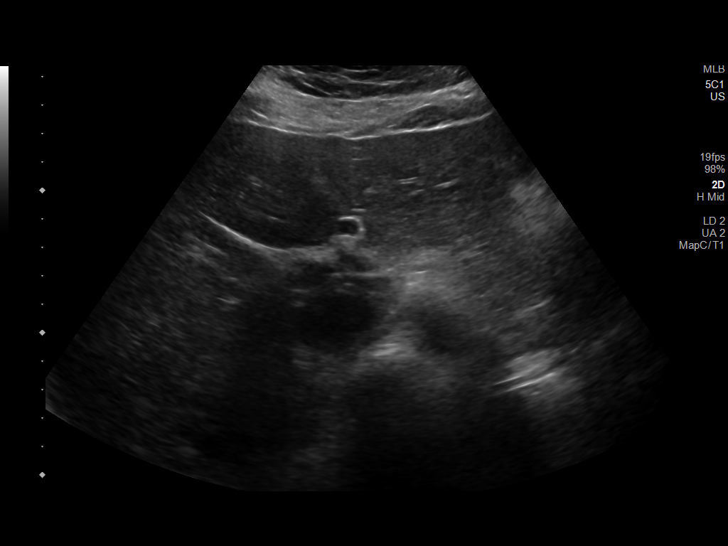
[im 49/78]
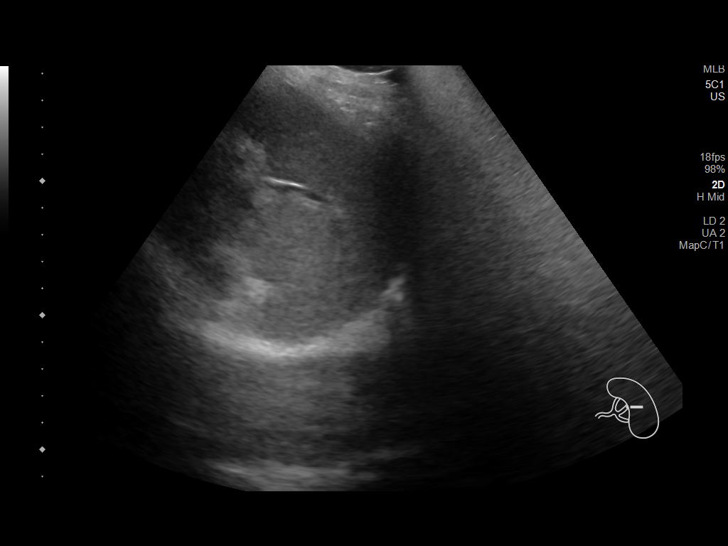
[im 52/78]
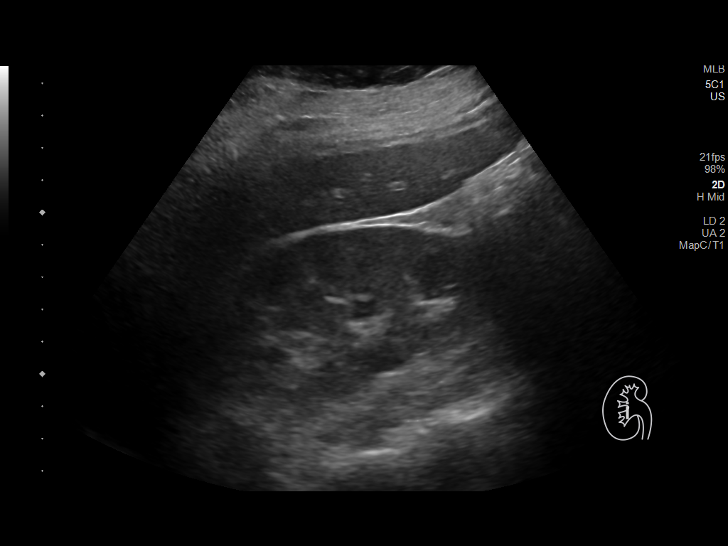
[im 58/78]
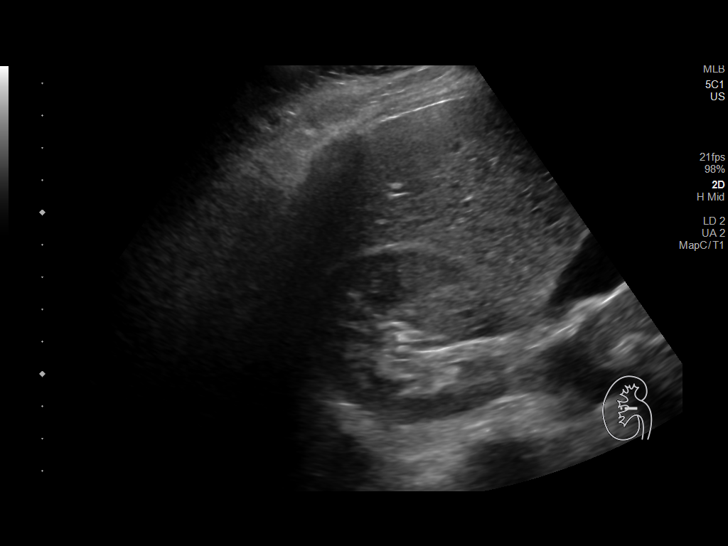
[im 65/78]
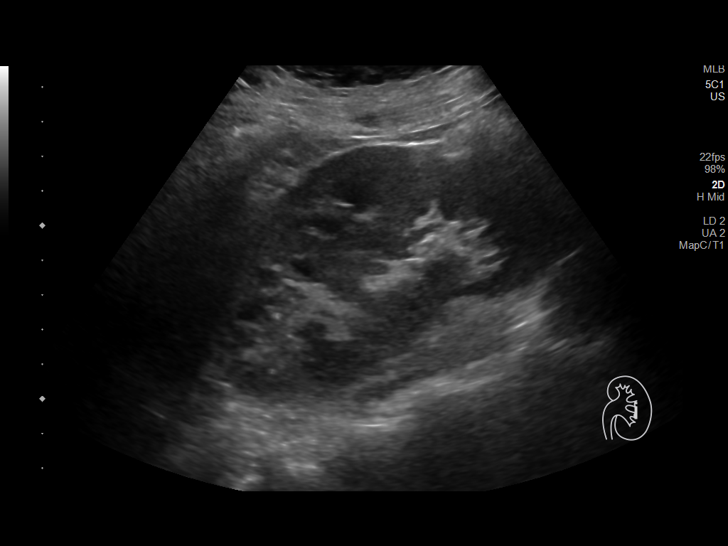
[im 71/78]
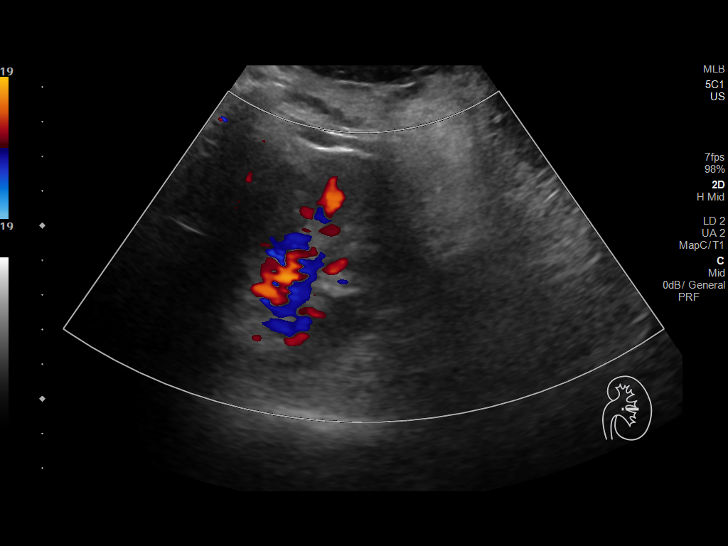
[im 78/78]
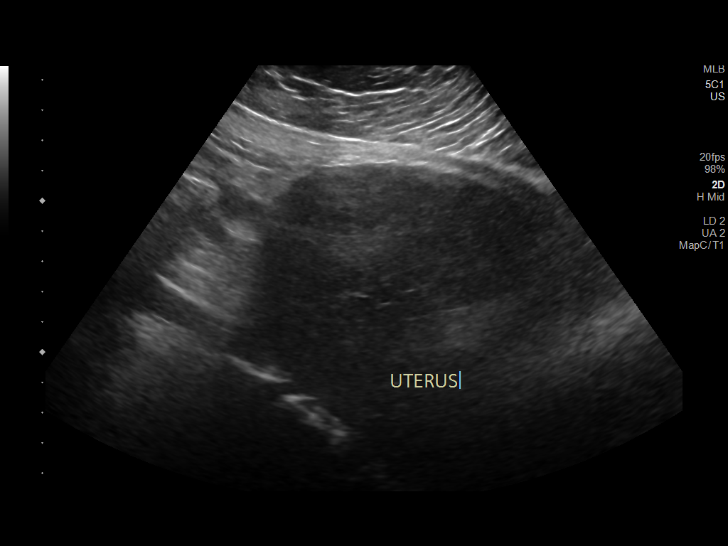

[14 of 25 positions shown; findings below may reference images not displayed]

FINDINGS: Gallbladder: No gallstones or wall thickening visualized. No
sonographic Murphy sign noted by sonographer.

Common bile duct: Diameter: Normal at 3 mm

Liver: No focal lesion identified. Within normal limits in
parenchymal echogenicity. Portal vein is patent on color Doppler
imaging with normal direction of blood flow towards the liver.

IVC: No abnormality visualized.

Pancreas: Visualized portion unremarkable.

Spleen: Size and appearance within normal limits.

Right Kidney: Length: 11.4 cm. Echogenicity within normal limits. No
mass or hydronephrosis visualized.

Left Kidney: Length: 11.3 cm. Echogenicity within normal limits. No
mass or hydronephrosis visualized.

Abdominal aorta: No aneurysm visualized.

Other findings: No ascites
IMPRESSION: Normal abdominal ultrasound.

## 2022-03-26 ENCOUNTER — Ambulatory Visit (INDEPENDENT_AMBULATORY_CARE_PROVIDER_SITE_OTHER): Payer: Self-pay | Admitting: Podiatry

## 2022-03-26 ENCOUNTER — Encounter: Payer: Self-pay | Admitting: Podiatry

## 2022-03-26 DIAGNOSIS — L603 Nail dystrophy: Secondary | ICD-10-CM

## 2022-03-26 NOTE — Progress Notes (Signed)
  Subjective:  Patient ID: Monique Bowman, female    DOB: Sep 01, 1986,  MRN: [id number] HPI Chief Complaint  Patient presents with   Nail Problem    1st and 2nd toenails right - thick and discolored x several months, tried tea tree oil and other OTC - no help   New Patient (Initial Visit)    36 y.o. female presents with the above complaint.   ROS: Denies fever chills nausea vomiting muscle aches pains calf pain back pain chest pain shortness of breath.  Past Medical History:  Diagnosis Date   Anemia    on iron   Heart murmur    Known health problems: none    Past Surgical History:  Procedure Laterality Date   MYOMECTOMY N/A 08/22/2020   Procedure: ABDOMINAL MYOMECTOMY;  Surgeon: Dian Queen, MD;  Location: [facility];  Service: Gynecology;  Laterality: N/A;   MYOMECTOMY N/A 08/31/2020   Procedure: VAGINAL MYOMECTOMY;  Surgeon: Everlene Farrier, MD;  Location: [facility];  Service: Gynecology;  Laterality: N/A;   NO PAST SURGERIES     No current outpatient medications on file.  No Known Allergies Review of Systems Objective:  There were no vitals filed for this visit.  General: Well developed, nourished, in no acute distress, alert and oriented x3   Dermatological: Skin is warm, dry and supple bilateral. Nails x 10 are well maintained; remaining integument appears unremarkable at this time. There are no open sores, no preulcerative lesions, no rash or signs of infection present.  Hallux and second nail plate right demonstrate thickening and discolored subungual debris some malodor but are generally well.  No tinea pedis no rashes  Vascular: Dorsalis Pedis artery and Posterior Tibial artery pedal pulses are 2/4 bilateral with immedate capillary fill time. Pedal hair growth present. No varicosities and no lower extremity edema present bilateral.   Neruologic: Grossly intact via light touch bilateral. Vibratory intact via tuning fork bilateral. Protective threshold with Semmes  Wienstein monofilament intact to all pedal sites bilateral. Patellar and Achilles deep tendon reflexes 2+ bilateral. No Babinski or clonus noted bilateral.   Musculoskeletal: No gross boney pedal deformities bilateral. No pain, crepitus, or limitation noted with foot and ankle range of motion bilateral. Muscular strength 5/5 in all groups tested bilateral.  Gait: Unassisted, Nonantalgic.    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    Plan: Discussed etiology pathology conservative surgical therapies at this point samples of skin and nail were taken today to be sent for pathologic evaluation and determination organism.     Spyridon Hornstein T. [place], [place]

## 2022-04-23 ENCOUNTER — Ambulatory Visit (INDEPENDENT_AMBULATORY_CARE_PROVIDER_SITE_OTHER): Payer: Self-pay | Admitting: Podiatry

## 2022-04-23 ENCOUNTER — Encounter: Payer: Self-pay | Admitting: Podiatry

## 2022-04-23 DIAGNOSIS — L603 Nail dystrophy: Secondary | ICD-10-CM

## 2022-04-23 DIAGNOSIS — Z79899 Other long term (current) drug therapy: Secondary | ICD-10-CM

## 2022-04-23 MED ORDER — TERBINAFINE HCL 250 MG PO TABS: 250.0000 mg | ORAL_TABLET | Freq: Every day | ORAL | 0 refills | Status: DC

## 2022-04-23 NOTE — Progress Notes (Signed)
She presents today for her pathology results.  Objective: She has 2 separate fight infections in the nail plates.  Assessment: Onychomycosis.  Plan: Discussed etiology pathology conservative versus surgical therapies.  We did discuss doing nothing and using laser therapy and oral therapy.  At this point she would like to try oral therapy we did discuss pros and cons of oral therapy and the need for blood work she understands this and is amenable to it we are going to go ahead and get her started on her first 30 days of Lamisil 250 mg tablets 1 p.o. daily.  We will follow-up with her in 1 month for complete metabolic panel.

## 2022-05-27 ENCOUNTER — Ambulatory Visit (INDEPENDENT_AMBULATORY_CARE_PROVIDER_SITE_OTHER): Payer: 59 | Admitting: Podiatry

## 2022-05-27 ENCOUNTER — Encounter: Payer: Self-pay | Admitting: Podiatry

## 2022-05-27 DIAGNOSIS — Z79899 Other long term (current) drug therapy: Secondary | ICD-10-CM | POA: Diagnosis not present

## 2022-05-27 DIAGNOSIS — L603 Nail dystrophy: Secondary | ICD-10-CM | POA: Diagnosis not present

## 2022-05-27 MED ORDER — TERBINAFINE HCL 250 MG PO TABS: 250.0000 mg | ORAL_TABLET | Freq: Every day | ORAL | 0 refills | Status: DC

## 2022-05-27 NOTE — Progress Notes (Signed)
She presents today for follow-up of her nail fungus she completed her first 30 days denies fever chills nausea vomiting muscle aches pains calf pain back pain chest pain shortness of breath.  States that the only side effect she had was sleepiness early on when she first started the medication.  Other than that no complications whatsoever.  Objective: Vital signs are stable she is alert and oriented x3.  Physical exam is relatively unchanged.  Assessment: Long-term therapy for onychomycosis with use of Lamisil tablets.  Plan: We are going to request a comprehensive metabolic panel and we are going to go ahead and refill her Lamisil 250 mg tablets 1 tablet daily for the next 3 months.  Should her blood work come back abnormal we will notify her.  I will follow-up with her in 4 months.

## 2022-05-28 ENCOUNTER — Telehealth: Payer: Self-pay | Admitting: *Deleted

## 2022-05-28 LAB — COMPREHENSIVE METABOLIC PANEL
AG Ratio: 1.6 (calc) (ref 1.0–2.5)
ALT: 9 U/L (ref 6–29)
AST: 12 U/L (ref 10–30)
Albumin: 4.4 g/dL (ref 3.6–5.1)
Alkaline phosphatase (APISO): 52 U/L (ref 31–125)
BUN: 13 mg/dL (ref 7–25)
CO2: 27 mmol/L (ref 20–32)
Calcium: 9.6 mg/dL (ref 8.6–10.2)
Chloride: 104 mmol/L (ref 98–110)
Creat: 0.69 mg/dL (ref 0.50–0.97)
Globulin: 2.7 g/dL (calc) (ref 1.9–3.7)
Glucose, Bld: 82 mg/dL (ref 65–99)
Potassium: 4.7 mmol/L (ref 3.5–5.3)
Sodium: 137 mmol/L (ref 135–146)
Total Bilirubin: 0.6 mg/dL (ref 0.2–1.2)
Total Protein: 7.1 g/dL (ref 6.1–8.1)

## 2022-05-28 NOTE — Telephone Encounter (Signed)
-----   Message from Garrel Ridgel, [place] sent at 05/28/2022  7:38 AM EDT ----- Blood work looks good may continue medication.

## 2022-09-26 ENCOUNTER — Ambulatory Visit: Payer: 59 | Admitting: Podiatry

## 2022-10-03 ENCOUNTER — Encounter: Payer: Self-pay | Admitting: Podiatry

## 2022-10-03 ENCOUNTER — Ambulatory Visit (INDEPENDENT_AMBULATORY_CARE_PROVIDER_SITE_OTHER): Payer: Medicaid Other | Admitting: Podiatry

## 2022-10-03 DIAGNOSIS — L603 Nail dystrophy: Secondary | ICD-10-CM

## 2022-10-03 DIAGNOSIS — L0889 Other specified local infections of the skin and subcutaneous tissue: Secondary | ICD-10-CM

## 2022-10-03 MED ORDER — TERBINAFINE HCL 250 MG PO TABS: 250.0000 mg | ORAL_TABLET | Freq: Every day | ORAL | 0 refills | Status: AC

## 2022-10-03 NOTE — Progress Notes (Signed)
Monique Bowman presents today for follow-up of her nail fungus that she has nearly completed 120 days of Lamisil states that she has about 2 weeks left.  She states that she has had no problems no symptoms no side effects.  Denies fever chills nausea vomit muscle aches pains calf pain back pain chest pain shortness of breath itching or rashes.  She states that they are looking a lot better she is very happy with the outcome.  Objective: Vital signs stable alert oriented x 3 all the nails of the left foot look perfect the hallux and second digit on the right foot are much lighter than they were however I do think there are some nail dystrophy playing a role in her thickness.  Assessment: Long-term therapy for onychomycosis with Lamisil.  Plan: At this point I am going to recommend another 30 tablets she will take 1 tablet every other day and then I will follow-up with her in about 4 months.

## 2022-10-03 NOTE — Patient Instructions (Signed)
Dr. Milinda Pointer has sent over a refill for Lamisil to your pharmacy today. The instructions on your bottle will say "take 1 tablet daily", however, he would like for you to take one pill every other day. He will follow up with you in 4 months to re-evaluate your toenails.

## 2023-02-06 ENCOUNTER — Ambulatory Visit: Payer: Medicaid Other | Admitting: Podiatry

## 2023-05-13 ENCOUNTER — Ambulatory Visit: Payer: Medicaid Other | Admitting: Podiatry
